# Patient Record
Sex: Male | Born: 1950 | Race: White | Hispanic: No | Marital: Single | Smoking: Never smoker
Health system: Southern US, Community
[De-identification: ages and names within clinical notes are randomized; demographics above are authoritative.]

## PROBLEM LIST (undated history)

## (undated) DIAGNOSIS — Z87442 Personal history of urinary calculi: Secondary | ICD-10-CM

## (undated) DIAGNOSIS — I639 Cerebral infarction, unspecified: Secondary | ICD-10-CM

## (undated) DIAGNOSIS — M1990 Unspecified osteoarthritis, unspecified site: Secondary | ICD-10-CM

## (undated) DIAGNOSIS — R270 Ataxia, unspecified: Secondary | ICD-10-CM

## (undated) DIAGNOSIS — R279 Unspecified lack of coordination: Secondary | ICD-10-CM

## (undated) HISTORY — PX: TONSILLECTOMY: SUR1361

---

## 2008-10-05 DIAGNOSIS — I639 Cerebral infarction, unspecified: Secondary | ICD-10-CM

## 2008-10-05 HISTORY — PX: OTHER SURGICAL HISTORY: SHX169

## 2008-10-05 HISTORY — DX: Cerebral infarction, unspecified: I63.9

## 2009-03-19 ENCOUNTER — Inpatient Hospital Stay (HOSPITAL_COMMUNITY): Admission: EM | Admit: 2009-03-19 | Discharge: 2009-04-03 | Payer: Self-pay | Admitting: Emergency Medicine

## 2009-03-19 ENCOUNTER — Ambulatory Visit: Payer: Self-pay | Admitting: Pulmonary Disease

## 2009-04-02 ENCOUNTER — Ambulatory Visit: Payer: Self-pay | Admitting: Physical Medicine & Rehabilitation

## 2009-04-03 ENCOUNTER — Inpatient Hospital Stay (HOSPITAL_COMMUNITY)
Admission: RE | Admit: 2009-04-03 | Discharge: 2009-04-18 | Payer: Self-pay | Admitting: Physical Medicine & Rehabilitation

## 2009-04-05 ENCOUNTER — Ambulatory Visit: Payer: Self-pay | Admitting: Physical Medicine & Rehabilitation

## 2009-05-07 ENCOUNTER — Encounter
Admission: RE | Admit: 2009-05-07 | Discharge: 2009-06-05 | Payer: Self-pay | Admitting: Physical Medicine & Rehabilitation

## 2009-05-07 ENCOUNTER — Emergency Department (HOSPITAL_COMMUNITY): Admission: EM | Admit: 2009-05-07 | Discharge: 2009-05-07 | Payer: Self-pay | Admitting: Emergency Medicine

## 2009-05-15 ENCOUNTER — Ambulatory Visit: Payer: Self-pay | Admitting: Family Medicine

## 2009-05-20 ENCOUNTER — Encounter
Admission: RE | Admit: 2009-05-20 | Discharge: 2009-05-24 | Payer: Self-pay | Admitting: Physical Medicine & Rehabilitation

## 2009-05-24 ENCOUNTER — Ambulatory Visit: Payer: Self-pay | Admitting: Physical Medicine & Rehabilitation

## 2010-02-27 ENCOUNTER — Encounter
Admission: RE | Admit: 2010-02-27 | Discharge: 2010-03-04 | Payer: Self-pay | Admitting: Physical Medicine & Rehabilitation

## 2010-03-04 ENCOUNTER — Ambulatory Visit: Payer: Self-pay | Admitting: Physical Medicine & Rehabilitation

## 2011-01-10 LAB — POCT I-STAT, CHEM 8
BUN: 15 mg/dL (ref 6–23)
Calcium, Ion: 1.08 mmol/L — ABNORMAL LOW (ref 1.12–1.32)
Chloride: 103 mEq/L (ref 96–112)
Creatinine, Ser: 0.9 mg/dL (ref 0.4–1.5)
Glucose, Bld: 89 mg/dL (ref 70–99)
HCT: 36 % — ABNORMAL LOW (ref 39.0–52.0)
Hemoglobin: 12.2 g/dL — ABNORMAL LOW (ref 13.0–17.0)
Potassium: 4.2 meq/L (ref 3.5–5.1)
Sodium: 136 mEq/L (ref 135–145)
TCO2: 22 mmol/L (ref 0–100)

## 2011-01-10 LAB — CBC
MCHC: 35.4 g/dL (ref 30.0–36.0)
MCV: 90.8 fL (ref 78.0–100.0)
RBC: 4.13 MIL/uL — ABNORMAL LOW (ref 4.22–5.81)
RDW: 13.3 % (ref 11.5–15.5)

## 2011-01-10 LAB — COMPREHENSIVE METABOLIC PANEL
AST: 16 U/L (ref 0–37)
CO2: 28 mEq/L (ref 19–32)
Calcium: 9.6 mg/dL (ref 8.4–10.5)
Creatinine, Ser: 0.73 mg/dL (ref 0.4–1.5)
GFR calc Af Amer: >60 mL/min (ref >60)
GFR calc non Af Amer: >60 mL/min (ref >60)
Total Protein: 6.5 g/dL (ref 6.0–8.3)

## 2011-01-10 LAB — URINALYSIS, ROUTINE W REFLEX MICROSCOPIC
Glucose, UA: NEGATIVE mg/dL
pH: 7.5 (ref 5.0–8.0)

## 2011-01-10 LAB — DIFFERENTIAL
Eosinophils Relative: 0 % (ref 0–5)
Lymphocytes Relative: 27 % (ref 12–46)
Lymphs Abs: 0.9 10*3/uL (ref 0.7–4.0)
Monocytes Relative: 8 % (ref 3–12)
Neutrophils Relative %: 64 % (ref 43–77)

## 2011-01-10 LAB — LIPASE, BLOOD: Lipase: 22 U/L (ref 11–59)

## 2011-01-11 LAB — COMPREHENSIVE METABOLIC PANEL
Albumin: 3.6 g/dL (ref 3.5–5.2)
Alkaline Phosphatase: 50 U/L (ref 39–117)
BUN: 12 mg/dL (ref 6–23)
CO2: 31 mEq/L (ref 19–32)
Chloride: 99 mEq/L (ref 96–112)
Glucose, Bld: 83 mg/dL (ref 70–99)
Potassium: 4.5 mEq/L (ref 3.5–5.1)
Total Bilirubin: 0.5 mg/dL (ref 0.3–1.2)

## 2011-01-11 LAB — DIFFERENTIAL
Basophils Absolute: 0 10*3/uL (ref 0.0–0.1)
Basophils Relative: 1 % (ref 0–1)
Monocytes Absolute: 0.5 10*3/uL (ref 0.1–1.0)
Neutro Abs: 2.4 10*3/uL (ref 1.7–7.7)

## 2011-01-11 LAB — CBC
HCT: 39.3 % (ref 39.0–52.0)
Hemoglobin: 14.1 g/dL (ref 13.0–17.0)
Platelets: 342 10*3/uL (ref 150–400)
RBC: 4.27 MIL/uL (ref 4.22–5.81)
WBC: 4.1 10*3/uL (ref 4.0–10.5)

## 2011-01-12 LAB — GLUCOSE, CAPILLARY
Glucose-Capillary: 107 mg/dL — ABNORMAL HIGH (ref 70–99)
Glucose-Capillary: 114 mg/dL — ABNORMAL HIGH (ref 70–99)
Glucose-Capillary: 121 mg/dL — ABNORMAL HIGH (ref 70–99)

## 2011-01-12 LAB — COMPREHENSIVE METABOLIC PANEL
ALT: 10 U/L (ref 0–53)
Alkaline Phosphatase: 46 U/L (ref 39–117)
CO2: 24 mEq/L (ref 19–32)
GFR calc non Af Amer: >60 mL/min (ref >60)
Glucose, Bld: 167 mg/dL — ABNORMAL HIGH (ref 70–99)
Potassium: 2.8 mEq/L — ABNORMAL LOW (ref 3.5–5.1)
Sodium: 140 mEq/L (ref 135–145)

## 2011-01-12 LAB — BASIC METABOLIC PANEL
BUN: 12 mg/dL (ref 6–23)
Chloride: 102 mEq/L (ref 96–112)
Creatinine, Ser: 0.69 mg/dL (ref 0.4–1.5)
Creatinine, Ser: 0.73 mg/dL (ref 0.4–1.5)
GFR calc Af Amer: >60 mL/min (ref >60)
GFR calc non Af Amer: >60 mL/min (ref >60)
Potassium: 3.3 mEq/L — ABNORMAL LOW (ref 3.5–5.1)
Potassium: 3.8 mEq/L (ref 3.5–5.1)
Sodium: 132 mEq/L — ABNORMAL LOW (ref 135–145)

## 2011-01-12 LAB — URINALYSIS, ROUTINE W REFLEX MICROSCOPIC
Nitrite: NEGATIVE
Specific Gravity, Urine: 1.026 (ref 1.005–1.030)
Urobilinogen, UA: 0.2 mg/dL (ref 0.0–1.0)

## 2011-01-12 LAB — CBC
HCT: 42 % (ref 39.0–52.0)
HCT: 39.6 % (ref 39.0–52.0)
Hemoglobin: 13.5 g/dL (ref 13.0–17.0)
MCHC: 34.2 g/dL (ref 30.0–36.0)
MCV: 92.1 fL (ref 78.0–100.0)
MCV: 91.5 fL (ref 78.0–100.0)
RBC: 4.55 MIL/uL (ref 4.22–5.81)
RBC: 4.32 MIL/uL (ref 4.22–5.81)
WBC: 6 10*3/uL (ref 4.0–10.5)

## 2011-01-12 LAB — CULTURE, BLOOD (ROUTINE X 2): Culture: NO GROWTH

## 2011-01-12 LAB — URINE MICROSCOPIC-ADD ON

## 2011-01-12 LAB — URINE CULTURE

## 2011-01-12 LAB — DIFFERENTIAL
Basophils Relative: 1 % (ref 0–1)
Eosinophils Absolute: 0.1 10*3/uL (ref 0.0–0.7)
Eosinophils Relative: 2 % (ref 0–5)
Monocytes Relative: 9 % (ref 3–12)
Neutrophils Relative %: 61 % (ref 43–77)

## 2011-01-12 LAB — MAGNESIUM: Magnesium: 1.8 mg/dL (ref 1.5–2.5)

## 2011-01-12 LAB — ETHANOL: Alcohol, Ethyl (B): <5 mg/dL (ref 0–10)

## 2011-01-12 LAB — OSMOLALITY: Osmolality: 290 mOsm/kg (ref 275–300)

## 2011-01-12 LAB — PROTIME-INR
INR: 1.1 (ref 0.00–1.49)
Prothrombin Time: 14.9 seconds (ref 11.6–15.2)

## 2011-01-12 LAB — POCT CARDIAC MARKERS
Myoglobin, poc: 50.9 ng/mL (ref 12–200)
Troponin i, poc: <0.05 ng/mL (ref 0.00–0.09)

## 2011-02-17 NOTE — Discharge Summary (Signed)
NAME:  Raymond Curtis, Raymond Curtis             ACCOUNT NO.:  [id number]   MEDICAL RECORD NO.:  [id number]          PATIENT TYPE:  INP   LOCATION:  3022                         FACILITY:  [facility]   PHYSICIAN:  Pramod P. Pearlean Brownie, MD    DATE OF BIRTH:  14-Oct-1950   DATE OF ADMISSION:  03/19/2009  DATE OF DISCHARGE:  04/03/2009                               DISCHARGE SUMMARY   ADMISSION DIAGNOSIS:  Intracranial bleeding.   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   HOSPITAL COURSE:  Raymond Curtis is a 60 year old Caucasian male who was  admitted for evaluation for sudden onset of confusion, dizziness,  headache while practicing yoga on the morning of the day of admission.  He had been in a pose with hyperextension of his neck for around 5-10  minutes.  When he noticed severe headache followed by confusion,  dizziness, difficulty to focus, and some numbness on the left side of  the face and clumsiness on the left body and mild slurred speech.  When  evaluated in the emergency room by Dr. Terrace Arabia, the neurologist on-call, he  was found to have mild left-sided weakness with incoordination and  nystagmus.  His NIH stroke scale was 7.  His blood pressure was not  significantly elevated.  CT scan of the head showed hemorrhage in the  posterior fossa in the left cerebellum as well as fourth ventricle with  hydrocephalus and blood in the lateral ventricles as well.  Neurosurgery  was consulted and Dr. Lovell Sheehan saw the patient and put him on emergent  ventriculostomy to treat the hydrocephalus.  The patient was admitted to  the Neurological Intensive Care Unit where blood pressure was tightly  monitored.  The patient was awake throughout the hospital stay.  He  initially complained of some subjective visual disturbance with depth  perception problems as well as left-sided incoordination and headaches.  Gradually, his headaches settled down and incoordination improved, but  he had some mild visual subjective depth perception problems.  The  patient had an emergent catheter angiogram performed by Dr. Corliss Skains on  March 19, 2009, which showed moderate-sized fast flow left cerebellar AVM  measuring 2.5 x 1.5 cm, which was fed by the left anteroinferior  cerebellar artery and a smaller branch from the left accessory  anteroinferior cerebellar artery with a single large draining vein into  the left transverse sinus.  There were no intranidal or prenidal  aneurysms or venous varices seen.  The patient was felt to be a  candidate for elective endovascular embolization verus gamma knife  treatment for this.  He was kept in the ICU until his CSF cleared from  blood and his CT scan showed improvement in hydrocephalus with complete  clearing of the ventricle systems of all blood.  He had serial CT scans  to document this.  He had his ventriculostomy catheter clamped on the  28th and repeat CT scan 24 hours later showed no significant change in  ventricular size.  The catheter was removed, and the patient was  observed  in the ICU for more than 24 hours.  He remained neurologically  stable without any new symptoms.  He was transferred to the floor and  considered stable to go for rehab since physical therapy on evaluation  found that he had significant left-sided ataxia and incoordination, and  suggested that he have inpatient rehab stay.  The plan from Neurosurgery  was to electively refer him for gamma knife procedure to [facility].  At the time of discharge, he was in a stable condition.   DISCHARGE MEDICATIONS:  None.  Not to take aspirin or nonsteroidal  antiinflammatory drugs for a month.   He was advised to follow up with Dr. Pearlean Brownie in his office in 2 months and  with Dr. Lovell Sheehan from Neurosurgery in a few weeks.           ______________________________  Sunny Schlein. Pearlean Brownie, MD      PPS/MEDQ  D:  04/04/2009  T:  04/05/2009  Job:  [id number]
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
## 2011-02-17 NOTE — Consult Note (Signed)
NAME:  Raymond Curtis, Raymond Curtis NO.:  [id number]   MEDICAL RECORD NO.:  [id number]          PATIENT TYPE:  INP   LOCATION:  3101                         FACILITY:  [facility]   PHYSICIAN:  Cristi Loron, M.D.DATE OF BIRTH:  July 21, 1951   DATE OF CONSULTATION:  03/19/2009  DATE OF DISCHARGE:                                 CONSULTATION   CHIEF COMPLAINT:  Headache.   HISTORY OF PRESENT ILLNESS:  The patient is a 60 year old white male who  was in his usual state of health until this morning.  At that time he  was doing yoga and stretching, he had a sudden onset of headache,  nausea, vomiting, dizziness, etc.  He called his sister who called the  EMS.  The patient was transferred to [facility] for further  evaluation which included a cranial CT scan demonstrated the patient had  a posterior fossa hemorrhage and a code stroke and neurosurgical  consultation was requested.   Presently, the patient is accompanied by sister and a family friend.  He  complains of headaches, nausea, vomiting, dizziness as above.  He also  complains of some decreased/blurred vision in his left eye.  He also has  facial numbness on the left.   PAST MEDICAL HISTORY:  Negative.   PAST SURGICAL HISTORY:  None.   MEDICATIONS PRIOR TO ADMISSION:  None.   No known drug allergies.   FAMILY MEDICAL HISTORY:  The patient's mother died in her 42s secondary  to complications of her cerebrovascular accident.  The patient's father  died in his 53s secondary to lung cancer.   SOCIAL HISTORY:  The patient is single.  He has no children.  He lives  in [place].  He is a Furniture conservator/restorer.  He denies tobacco and drug  use.  He drinks alcohol occasionally.   REVIEW OF SYSTEMS:  Negative except as above.   PHYSICAL EXAMINATION:  GENERAL:  A pleasant thin 60 year old white male,  complains of headache, speech difficulties, and facial numbness.  HEENT:  Normocephalic.  His pupils are equal at 3  mm, sluggish.  He has  conjugate gaze.  He has somewhat limited extraocular muscle range of  motion.  He has left-sided facial numbness with left facial droop.  There is no Battle sign or raccoon eyes.  NECK:  Supple.  There are no mass or deformities.  Thin.  He has normal  range of motion.  THORAX:  Symmetric.  ABDOMEN:  Soft.  EXTREMITIES:  No obvious deformities.  NEUROLOGIC:  The patient is alert and oriented x2 (person and place).  He could not tell me the month or the year.  He has got a Glasgow coma  scale of 14 (E4, M6, V4).  The patient's motor strength is 5/5 in  deltoid, biceps, triceps, hand grip, quadriceps, gastrocnemius, extensor  longus.  His deep tendon reflexes are 2/4 in biceps, triceps, 2+/4 in  quadriceps, gastrocnemius.  Sensory function is intact to light touch,  sensation in all tested dermatomes except his left facial numbness.  Cerebellar function is intact to rapid alternating movements of the  upper  extremities bilaterally.  Cranial nerves II-XII examined  bilaterally and grossly normal except as above.  He does have left  facial numbness.  He has left facial droop.  His uvula deviated to the  right.   IMAGES:  As I reviewed, the patient's head CT performed without contrast  at [facility] on March 10, 2009, it demonstrates the patient has  a hemorrhage in the patient's left cerebellopontine angle extending into  the fourth ventricle.  There is quite a bit of blood in the fourth  ventricle.  He has blood which extends up to the third and lateral  ventricles.  He has moderate hydrocephalus.   ASSESSMENT AND PLAN:  Posterior fossa hemorrhage or early hydrocephalus.  I discussed the issues with the patient, his sister, and the family  friend ( at the patient's request).  I have told him he has early  hydrocephalus and we discussed the various treatment options.  I  recommended we place a ventriculostomy.  I described the procedure and  the risks  including risks of infection, bleeding, ventriculostomy  malplacement and malfunction, etc.  We have also discussed alternative  of observation. As he already has hydrocephalus, I think this will  likely worsen.   We also discussed we will need to look for a source of bleeding.  I  recommended he either get CT angiogram or arteriogram.  I will place a  ventriculostomy.  I have answered all the patient's and family's  questions.  He elected to proceed with a ventriculostomy placed in the  ICU.       Cristi Loron, M.D.  Electronically Signed     JDJ/MEDQ  D:  03/19/2009  T:  03/20/2009  Job:  [id number]

## 2011-02-17 NOTE — H&P (Signed)
NAME:  Raymond Curtis, Raymond Curtis             ACCOUNT NO.:  [id number]   MEDICAL RECORD NO.:  [id number]          PATIENT TYPE:  INP   LOCATION:  3101                         FACILITY:  [facility]   PHYSICIAN:  Levert Feinstein, MD          DATE OF BIRTH:  07-21-1951   DATE OF ADMISSION:  03/19/2009  DATE OF DISCHARGE:                              HISTORY & PHYSICAL   CHIEF COMPLAINT:  Intracranial bleeding.   HISTORY OF PRESENT ILLNESS:  The patient is a 60 year old right-handed  Caucasian male, previously healthy, presenting with acute onset of  confusion, dizziness, headache, while practicing yoga this morning at 11  o'clock.   In routine, the patient practicing yoga including hyperextend his neck,  this morning around 11 o'clock, he was holding the position of  hyperextending his neck for about 5 minutes, he suddenly experienced  acute onset of left temporal and forehead area mild pressure headache  2/10, at the same time, he felt confused, dizziness, difficult to focus,  left facial numbness involving left forehead, cheek, and mandibular  region, and also inside of his left mouth, he felt clumsiness in his  left arm and leg, mild dysarthria.  Symptoms have been progressively  worse since its onset.   CT of the brain has shown posterior fossa hemorrhage, breaking into the  fourth ventricular system also involving the aqua duct, third ventricle,  and bilateral lateral ventricle, involving the left cerebellar peduncle,  mild hemorrhagic component in the basilar cystern noticed, there was  also mild obstructive hydrocephalus.   The patient remained alert, awake.   REVIEW OF SYSTEMS:  Pertinent as above.   PAST MEDICAL HISTORY:  None.   MEDICATIONS:  None.   ALLERGIES:  No known drug allergy.   PAST SURGICAL HISTORY:  None.   FAMILY HISTORY:  Noncontributory.   SOCIAL HISTORY:  He works part-time as a Leisure centre manager, lives with his  sister, and denies smoking or drinking.   PHYSICAL  EXAMINATION:  VITAL SIGNS:  He is afebrile.  Blood pressure  130/70, heart rate of 72, respiration of 16.  CARDIAC:  Regular rate and rhythm.  PULMONARY:  Clear to auscultation bilaterally.  NECK:  Supple.  No carotid bruits.  NEUROLOGIC:  He is awake, alert, oriented x4.  Mild dysarthria, but give  detailed history.  No aphasia.  Cranial nerves II-XII.  Pupil equal,  round, reactive to light, disconjugate eye movement, right side is  hyperexophoria, left side is actual hypophoria, nystagmus on extreme  gaze to the left and right up or down direction.  He also has decreased  light touch cornea on the left side, left facial weakness involving  forehead eye closure, cheek puff, uvula and tongue midline.  Head  turning, shoulder shrugging normal and symmetric.  Tongue protrusion  into cheek strength was normal.  MOTOR:  He has mild left upper extremity weakness 5-/5, sensory mildly  decreased light touch involving the left upper and lower extremity, but  no extinction.  Deep tendon reflexes brisk and symmetric.  Plantar  responses were extensor bilaterally.  Coordination, mild-to-moderate  dysmetria, left arm  and leg more obvious than the right side.  NIH  stroke scale is 7.   LABORATORY EVALUATION:  Creatinine 0.8.  Mildly elevated glucose 164.  Normal CBC.  Troponin was negative.   ASSESSMENT AND PLAN:  A 60 year old gentleman, previously healthy,  presenting with acute onset of the intraventricular, posterior fossa  bleeding, differentiation diagnosis including left posterior  communicating artery aneurysm versus transient hypertensive bleeding,  versus vascular malformation.  1. Discussed with neurosurgeon, Dr. Tressie Stalker, who will do      intraventriculostomy.  2. Also discussed with interventional radiology, Dr. Corliss Skains, will      follow by 4-vessel angiogram.  3. Admit to ICU.  4. Keep normal blood pressure.      Levert Feinstein, MD  Electronically Signed     YY/MEDQ   D:  03/19/2009  T:  03/20/2009  Job:  [id number]

## 2011-02-17 NOTE — Assessment & Plan Note (Signed)
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  He is left with a left cerebellar syndrome  with ataxia, nausea, dizziness, and vertigo.  He is now walking with  therapy, using walkers and crutches and is able to walk on his own with  these at home.  He generally requires supervision with his gait.  He is  a bit frustrated by his progress overall.  He has had some nausea, for  which he was started on Phenergan by an ER physician and this has  helped, and he seems to be weaning down off this.  He denies pain today.  Bowel and bladder functions have been normal.  He is seeing the staff at  [facility] regarding possible treatment of his AVM with Gamma  Knife.   REVIEW OF SYSTEMS:  Notable for trouble walking, dizziness, loss of  taste, weight loss due to persistent nausea.  Other pertinent positives  are above and full review is in the written health and history section  of the chart.   SOCIAL HISTORY:  The patient is single, living with his sisters who is  with him today.   PHYSICAL EXAMINATION:  Blood pressure is 104/36, pulse 60, respiratory  rate 18.  He is sating 98% on room air.  The patient is pleasant, alert  and oriented x3.  Weight appears to be generally stable.  He is slight  of build at baseline.  The patient with ataxia of his left arm, greater  than his leg.  He does tend to wean and lurch to the left, however, when  he walks.  I walked with him using handheld assistance today and he did  fairly well in all and was much improved at my last exam.  The patient  has normal sensation and strength in all 4 limbs.  He does have 5-6  beats of nystagmus with gaze to the left and left lower quadrant areas.  Cognitively, he is intact.  Heart is regular.  Chest is clear.  Abdomen  is soft, nontender.  I examined the left ear which was full of wax in  the external canal.  I was actually able to remove some wax with the tip  of  the otoscope.  I was unable to visualize the eardrum.   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  The patient is  making some nice progress.   PLAN:  1. Refilled Phenergan for breakthrough nausea symptoms.  This seems to      be helping and he has been able to wean down off this it appears.  2. Await treatment plan per [facility].  3. Encouraged ongoing therapy work on balance, gait, coordination,      etc.  I really think he has made progress, but it may take a few      more months for him to really get to a point where he is      comfortable and near to his baseline.  4. We will send the patient to ENT for evaluation of his ear.  He had      substantial wax in the ear today, but there may be some underlying      hearing loss related to the AVM and the effects on his cochlear      nerve.  This is secondhand report from treating physicians at      [facility].  Recommended use of  his Cerumenex at home to help further      clean ear if possible as well as manual cleaning with a Q-tip.  5. I will see the patient back in 2 months' time.      Ranelle Oyster, M.D.  Electronically Signed     ZTS/MedQ  D:  05/24/2009 11:01:32  T:  05/25/2009 04:54:09  Job #:  [id number]

## 2013-09-09 ENCOUNTER — Emergency Department (HOSPITAL_COMMUNITY): Payer: Medicaid Other

## 2013-09-09 ENCOUNTER — Encounter (HOSPITAL_COMMUNITY): Payer: Self-pay | Admitting: Emergency Medicine

## 2013-09-09 ENCOUNTER — Emergency Department (HOSPITAL_COMMUNITY)
Admission: EM | Admit: 2013-09-09 | Discharge: 2013-09-09 | Disposition: A | Discharge status: Home / Self Care | Payer: Medicaid Other | Attending: Emergency Medicine | Admitting: Emergency Medicine

## 2013-09-09 DIAGNOSIS — Z8673 Personal history of transient ischemic attack (TIA), and cerebral infarction without residual deficits: Secondary | ICD-10-CM | POA: Insufficient documentation

## 2013-09-09 DIAGNOSIS — N200 Calculus of kidney: Secondary | ICD-10-CM

## 2013-09-09 HISTORY — DX: Cerebral infarction, unspecified: I63.9

## 2013-09-09 LAB — POCT I-STAT, CHEM 8
BUN: 20 mg/dL (ref 6–23)
Calcium, Ion: 1.22 mmol/L (ref 1.13–1.30)
Chloride: 103 mEq/L (ref 96–112)
HCT: 40 % (ref 39.0–52.0)
Potassium: 4 mEq/L (ref 3.5–5.1)

## 2013-09-09 LAB — URINE MICROSCOPIC-ADD ON

## 2013-09-09 LAB — URINALYSIS, ROUTINE W REFLEX MICROSCOPIC
Bilirubin Urine: NEGATIVE
Glucose, UA: NEGATIVE mg/dL
Ketones, ur: NEGATIVE mg/dL
Specific Gravity, Urine: 1.017 (ref 1.005–1.030)
pH: 5.5 (ref 5.0–8.0)

## 2013-09-09 MED ORDER — ONDANSETRON 4 MG PO TBDP: 4.0000 mg | ORAL_TABLET | Freq: Three times a day (TID) | ORAL | Status: DC | PRN

## 2013-09-09 MED ORDER — OXYCODONE-ACETAMINOPHEN 5-325 MG PO TABS: 1.0000 | ORAL_TABLET | Freq: Four times a day (QID) | ORAL | Status: DC | PRN

## 2013-09-09 MED ORDER — MORPHINE SULFATE 4 MG/ML IJ SOLN
4.0000 mg | Freq: Once | INTRAMUSCULAR | Status: AC
  Administered 2013-09-09: 4 mg via INTRAVENOUS
  Filled 2013-09-09: qty 1

## 2013-09-09 MED ORDER — TAMSULOSIN HCL 0.4 MG PO CAPS: 0.4000 mg | ORAL_CAPSULE | Freq: Every day | ORAL | Status: DC

## 2013-09-09 MED ORDER — ONDANSETRON HCL 4 MG/2ML IJ SOLN
4.0000 mg | Freq: Once | INTRAMUSCULAR | Status: AC
  Administered 2013-09-09: 4 mg via INTRAVENOUS
  Filled 2013-09-09: qty 2

## 2013-09-09 NOTE — ED Notes (Signed)
Pt reports 2 days ago he noticed his urine was tea colored, then the past 2 days he had been raking leaves. Last night he had a sudden onset of left flank pain, tender to touch. sts before he went to sleep he noticed that his urine was light red. Denies dysuria, denies urinary frequency. No other complaints. Nad, skin warm and dry, resp e/u.

## 2013-09-09 NOTE — ED Notes (Signed)
Pt attempting to provide urine sample

## 2013-09-09 NOTE — ED Provider Notes (Signed)
Medical screening examination/treatment/procedure(s) were performed by non-physician practitioner and as supervising physician I was immediately available for consultation/collaboration.  EKG Interpretation   None        Doug Sou, MD 09/09/13 1521

## 2013-09-09 NOTE — ED Provider Notes (Signed)
CSN: [id number]     Arrival date & time 09/09/13  0940 History   First MD Initiated Contact with Patient 09/09/13 [phone]     Chief Complaint  Patient presents with  . Urinary Tract Infection   (Consider location/radiation/quality/duration/timing/severity/associated sxs/prior Treatment) HPI Comments: Patient presents today with a chief complaint of left flank pain.  He reports that he began having the pain last night and that the pain has been constant since that time.  However, he reports that at times the pain becomes more severe.  Nothing in particular makes the pain worse.  Pain improved with changes in position.  He reports that the pain is beginning to radiate to the left lower abdomen.  He has taken Advil for the pain with some relief.  He reports that last evening he noticed a reddish color to his urine.  He denies any other urinary symptoms.  He is able to urinate.  He reports that he is currently feeling nauseous, but no vomiting.  He denies fever or chills.  No history of kidney stones.  The history is provided by the patient.    Past Medical History  Diagnosis Date  . Stroke 2010    bleed   Past Surgical History  Procedure Laterality Date  . Tonsillectomy     No family history on file. History  Substance Use Topics  . Smoking status: Never Smoker   . Smokeless tobacco: Not on file  . Alcohol Use: No    Review of Systems  All other systems reviewed and are negative.    Allergies  Review of patient's allergies indicates no known allergies.  Home Medications   Current Outpatient Rx  Name  Route  Sig  Dispense  Refill  . Ibuprofen 200 MG CAPS   Oral   Take 1 capsule by mouth every 4 (four) hours as needed.         . Multiple Vitamins-Minerals (MULTIVITAMIN WITH MINERALS) tablet   Oral   Take 1 tablet by mouth daily.          BP 134/92  Pulse 66  Temp(Src) 99 F (37.2 C) (Oral)  Resp 22  SpO2 100% Physical Exam  Nursing note and vitals  reviewed. Constitutional: He appears well-developed and well-nourished.  HENT:  Head: Normocephalic and atraumatic.  Mouth/Throat: Oropharynx is clear and moist.  Neck: Normal range of motion. Neck supple.  Cardiovascular: Normal rate, regular rhythm and normal heart sounds.   Pulmonary/Chest: Effort normal and breath sounds normal.  Abdominal: Soft. Bowel sounds are normal. He exhibits no distension and no mass. There is no tenderness. There is no rebound and no guarding.  Left CVA tenderness  Neurological: He is alert.  Skin: Skin is warm and dry.  Psychiatric: He has a normal mood and affect.    ED Course  Procedures (including critical care time) Labs Review Labs Reviewed  URINALYSIS, ROUTINE W REFLEX MICROSCOPIC   Imaging Review Ct Abdomen Pelvis Wo Contrast  09/09/2013   CLINICAL DATA:  Left flank pain, hematuria, dark urine.  EXAM: CT ABDOMEN AND PELVIS WITHOUT CONTRAST  TECHNIQUE: Multidetector CT imaging of the abdomen and pelvis was performed following the standard protocol without intravenous contrast.  COMPARISON:  None.  FINDINGS: There is moderate hydronephrosis on the left. This appears to be secondary to a 9 x 10 mm stone at the ureterovesical junction demonstrated on image 69 of series 2. There are other nonobstructing stones in the left kidney. On the right there is no  evidence of obstruction but multiple stones are demonstrated. The largest lies in the midpole and measures approximately 5 mm in diameter. The prostate gland appears top normal in size and contains calcifications. The seminal vesicles are grossly normal.  The liver, gallbladder, spleen, partially distended stomach, pancreas, and adrenal glands appear normal for the noncontrast study. The caliber of the abdominal aorta is not normal. The psoas musculature appears normal. There is a fairly normal appearing stool and gas pattern with no evidence of obstruction or free extraluminal gas collections.  The lumbar  vertebral bodies are preserved in height. There is grade 1 anterolisthesis of L4 with respect to L5 which is on the basis of bilateral pars defects. There is considerable degenerative disc change at this level as well. The lung bases are clear.  IMPRESSION: 1. There is moderate hydronephrosis and hydroureter on the left secondary to a 9 x 10 mm diameter stone at the ureteropelvic skull junction. It appears moist to enter the urinary bladder. 2. There are nonobstructing stones elsewhere in both kidneys. 3. There is no evidence of acute hepatobiliary abnormality nor acute bowel abnormality. The abdominal aorta exhibits no evidence of an aneurysm. 4. There is grade 1 anterolisthesis of L4 with respect L5 secondary to bilateral pars defects and degenerative disc change.   Electronically Signed   By: David  [place]   On: 09/09/2013 11:44    EKG Interpretation   None      Patient discussed with Dr. Jolyne Loa with Urology.  He reports that the patient will be called by the office of [facility] to schedule a follow up appointment. MDM  No diagnosis found. Pt has been diagnosed with a Kidney Stone via CT.  Creatine WNL.  Vitals sign stable and the pt does not have irratractable vomiting or pain. Pain controlled at time of discharge.  UA not showing any signs of infection.  Patient afebrile.  Patient discussed with Urology and arranged follow up.  Pt will be dc home with pain medications, Zofran ODT, and Flomax.  Patient stable for discharge.  Return precautions given.      Santiago Glad, PA-C 09/09/13 [phone]

## 2013-09-09 NOTE — ED Notes (Signed)
Pt returned from radiology.

## 2013-09-11 ENCOUNTER — Telehealth (HOSPITAL_BASED_OUTPATIENT_CLINIC_OR_DEPARTMENT_OTHER): Payer: Self-pay

## 2013-09-11 NOTE — Telephone Encounter (Signed)
I contacted [facility] and set up appointment for Dec 29th at [facility].  Papers faxed to MD office [phone]

## 2013-10-03 ENCOUNTER — Other Ambulatory Visit: Payer: Self-pay | Admitting: Urology

## 2013-10-06 ENCOUNTER — Encounter (HOSPITAL_COMMUNITY)
Admission: RE | Admit: 2013-10-06 | Discharge: 2013-10-06 | Disposition: A | Discharge status: Home / Self Care | Payer: Medicaid Other | Source: Ambulatory Visit | Attending: Urology | Admitting: Urology

## 2013-10-06 ENCOUNTER — Encounter (HOSPITAL_COMMUNITY): Payer: Self-pay | Admitting: Pharmacy Technician

## 2013-10-06 ENCOUNTER — Encounter (HOSPITAL_COMMUNITY): Payer: Self-pay

## 2013-10-06 DIAGNOSIS — Z01810 Encounter for preprocedural cardiovascular examination: Secondary | ICD-10-CM | POA: Insufficient documentation

## 2013-10-06 DIAGNOSIS — Z01818 Encounter for other preprocedural examination: Secondary | ICD-10-CM | POA: Insufficient documentation

## 2013-10-06 DIAGNOSIS — Z01812 Encounter for preprocedural laboratory examination: Secondary | ICD-10-CM | POA: Insufficient documentation

## 2013-10-06 HISTORY — DX: Personal history of urinary calculi: Z87.442

## 2013-10-06 HISTORY — DX: Unspecified osteoarthritis, unspecified site: M19.90

## 2013-10-06 HISTORY — DX: Unspecified lack of coordination: R27.9

## 2013-10-06 HISTORY — DX: Ataxia, unspecified: R27.0

## 2013-10-06 LAB — CBC
HEMATOCRIT: 41.6 % (ref 39.0–52.0)
Hemoglobin: 14.7 g/dL (ref 13.0–17.0)
MCH: 30.8 pg (ref 26.0–34.0)
MCHC: 35.3 g/dL (ref 30.0–36.0)
MCV: 87.2 fL (ref 78.0–100.0)
Platelets: 196 10*3/uL (ref 150–400)
RBC: 4.77 MIL/uL (ref 4.22–5.81)
RDW: 12.6 % (ref 11.5–15.5)
WBC: 5.5 10*3/uL (ref 4.0–10.5)

## 2013-10-06 LAB — BASIC METABOLIC PANEL
BUN: 15 mg/dL (ref 6–23)
CHLORIDE: 100 meq/L (ref 96–112)
CO2: 29 meq/L (ref 19–32)
CREATININE: 0.76 mg/dL (ref 0.50–1.35)
Calcium: 9.7 mg/dL (ref 8.4–10.5)
GFR calc Af Amer: >90 mL/min (ref >90)
GFR calc non Af Amer: >90 mL/min (ref >90)
Glucose, Bld: 91 mg/dL (ref 70–99)
Potassium: 4.5 mEq/L (ref 3.7–5.3)
Sodium: 138 mEq/L (ref 137–147)

## 2013-10-06 NOTE — Patient Instructions (Signed)
Raymond BosRobert G Hilbert  10/06/2013                           YOUR PROCEDURE IS SCHEDULED ON: 10/10/13               PLEASE REPORT TO SHORT STAY CENTER AT : 5:30 AM               CALL THIS NUMBER IF ANY PROBLEMS THE DAY OF SURGERY :               [phone]                      REMEMBER:   Do not eat food or drink liquids AFTER MIDNIGHT   Take these medicines the morning of surgery with A SIP OF WATER: none   Do not wear jewelry, make-up   Do not wear lotions, powders, or perfumes.   Do not shave legs or underarms 12 hrs. before surgery (men may shave face)  Do not bring valuables to the hospital.  Contacts, dentures or bridgework may not be worn into surgery.  Leave suitcase in the car. After surgery it may be brought to your room.  For patients admitted to the hospital more than one night, checkout time is 11:00                          The day of discharge.   Patients discharged the day of surgery will not be allowed to drive home                             If going home same day of surgery, must have someone stay with you first                           24 hrs at home and arrange for some one to drive you home from hospital.    Special Instructions:   Please read over the following fact sheets that you were given:                                    1. [place] PREPARING FOR SURGERY SHEET                                                X_____________________________________________________________________        Failure to follow these instructions may result in cancellation of your surgery

## 2013-10-09 NOTE — H&P (Signed)
History of Present Illness Consult for left ureteral stone and hydronephrosis referred by Dr. Ethelda ChickJacubowitz. Pt developed left flank pain and red colored urine about 3 weeks ago. CT A/P revealed a 9 x 10 mm left UVJ stone (HU 930, SSD 9.5 cm, visible scout ) with proximal HUN. There were multiple renal stones - largest in right kidney 5 mm. I reviewed all the images.     Today, he is well. No pain. No hematuria. No fever. He hasn't seen a stone pass. He was given three meds and hasn't needed any pain meds. He didnt start tamsulosin. He doesn't like to take meds.     He had a ruptued AVM in 2010 and a CVA.     No prior stones.   Past Medical History Problems  1. History of stroke (V12.54) 2. History of Intracranial shunt (V45.2)  Surgical History Problems  1. History of Cranial Tap Of Shunt Tubing 2. History of Tonsillectomy Over Age 63  Current Meds 1. Ibuprofen TABS;  Therapy: (Recorded:29Dec2014) to Recorded 2. Multi-Day Vitamins TABS;  Therapy: (Recorded:29Dec2014) to Recorded  Allergies Medication  1. No Known Drug Allergies  Family History Problems  1. Family history of Death of family member : Mother, Father 2. Family history of lung cancer (V16.1) : Father  Social History Problems    Denied: History of Alcohol use   Denied: History of Caffeine use   Disabled   Never smoked tobacco (V49.89)   Single  Review of Systems Genitourinary, constitutional, skin, eye, otolaryngeal, hematologic/lymphatic, cardiovascular, pulmonary, endocrine, musculoskeletal, gastrointestinal, neurological and psychiatric system(s) were reviewed and pertinent findings if present are noted.  Gastrointestinal: nausea and heartburn.  Musculoskeletal: joint pain.  Neurological: dizziness.    Vitals Vital Signs [Data Includes: Last 1 Day]  Recorded: 29Dec2014 04:00PM  Height: 6 ft  Weight: 150 lb  BMI Calculated: 20.34 BSA Calculated: 1.89 Blood Pressure: 130 / 76 Temperature:  97.8 F Heart Rate: 52  Physical Exam Constitutional: Well nourished and well developed . No acute distress.  ENT:. The ears and nose are normal in appearance.  Neck: The appearance of the neck is normal and no neck mass is present.  Pulmonary: No respiratory distress and normal respiratory rhythm and effort.  Cardiovascular: Heart rate and rhythm are normal . No peripheral edema.  Abdomen: The abdomen is soft and nontender. No masses are palpated. No CVA tenderness. No hernias are palpable. No hepatosplenomegaly noted.  Lymphatics: The femoral and inguinal nodes are not enlarged or tender.  Skin: Normal skin turgor, no visible rash and no visible skin lesions.  Neuro/Psych:. Mood and affect are appropriate.    Results/Data Urine [Data Includes: Last 1 Day]   29Dec2014  COLOR YELLOW   APPEARANCE CLEAR   SPECIFIC GRAVITY 1.020   pH 5.5   GLUCOSE NEG mg/dL  BILIRUBIN NEG   KETONE NEG mg/dL  BLOOD NEG   PROTEIN NEG mg/dL  UROBILINOGEN 0.2 mg/dL  NITRITE NEG   LEUKOCYTE ESTERASE NEG    Old records or history reviewed:Marland Kitchen.  The following images/tracing/specimen were independently visualized: Marland Kitchen.    Procedure KUB - comparison CT - large stone at left UVJ, no ureteral stones, left kidney not well seen, multiple stones in right kidney, normal bone and bowel gas     Assessment Assessed  1. Ureteral stone (592.1) 2. Hydronephrosis (591) 3. Nephrolithiasis (592.0)  Plan Health Maintenance  1. UA With REFLEX; [Do Not Release]; Status:Complete;   Done: 29Dec2014 03:41PM Hydronephrosis, Nephrolithiasis, Ureteral stone  2.  Follow-up Schedule Surgery Office  Follow-up  Status: Hold For - Appointment   Requested for: 29Dec2014 Ureteral stone  3. KUB; Status:Resulted - Requires Verification;   Done: 29Dec2014 12:00AM  Discussion/Summary Discussed with patient nature, risks and benefits of off-label alpha blocker use, URS/stent (ureteral injury, failure to gain access, need for  stent/staged), or ESWL. He wants the "more precise" method and elects to proceed with URS. I recommended we start tamsulosin as it may pass, but not likely. Again he doesn't want to start meds. We discussed going after stone in the right, but we will hold off.     Signatures Electronically signed by : Jerilee Field, M.D.; Oct 02 2013  5:04PM EST

## 2013-10-10 ENCOUNTER — Ambulatory Visit (HOSPITAL_COMMUNITY)
Admission: RE | Admit: 2013-10-10 | Discharge: 2013-10-10 | Disposition: A | Discharge status: Home / Self Care | Payer: Medicaid Other | Source: Ambulatory Visit | Attending: Urology | Admitting: Urology

## 2013-10-10 ENCOUNTER — Encounter (HOSPITAL_COMMUNITY): Payer: Medicaid Other | Admitting: Anesthesiology

## 2013-10-10 ENCOUNTER — Encounter (HOSPITAL_COMMUNITY): Payer: Self-pay | Admitting: *Deleted

## 2013-10-10 ENCOUNTER — Encounter (HOSPITAL_COMMUNITY)
Admission: RE | Disposition: A | Discharge status: Home / Self Care | Payer: Self-pay | Source: Ambulatory Visit | Attending: Urology

## 2013-10-10 ENCOUNTER — Ambulatory Visit (HOSPITAL_COMMUNITY): Payer: Medicaid Other | Admitting: Anesthesiology

## 2013-10-10 DIAGNOSIS — M2550 Pain in unspecified joint: Secondary | ICD-10-CM | POA: Insufficient documentation

## 2013-10-10 DIAGNOSIS — R42 Dizziness and giddiness: Secondary | ICD-10-CM | POA: Insufficient documentation

## 2013-10-10 DIAGNOSIS — Z8673 Personal history of transient ischemic attack (TIA), and cerebral infarction without residual deficits: Secondary | ICD-10-CM | POA: Insufficient documentation

## 2013-10-10 DIAGNOSIS — N201 Calculus of ureter: Secondary | ICD-10-CM | POA: Insufficient documentation

## 2013-10-10 DIAGNOSIS — R12 Heartburn: Secondary | ICD-10-CM | POA: Insufficient documentation

## 2013-10-10 DIAGNOSIS — N1330 Unspecified hydronephrosis: Secondary | ICD-10-CM | POA: Insufficient documentation

## 2013-10-10 DIAGNOSIS — N200 Calculus of kidney: Secondary | ICD-10-CM | POA: Insufficient documentation

## 2013-10-10 DIAGNOSIS — R110 Nausea: Secondary | ICD-10-CM | POA: Insufficient documentation

## 2013-10-10 HISTORY — PX: HOLMIUM LASER APPLICATION: SHX5852

## 2013-10-10 HISTORY — PX: CYSTOSCOPY WITH URETEROSCOPY AND STENT PLACEMENT: SHX6377

## 2013-10-10 SURGERY — CYSTOURETEROSCOPY, WITH STENT INSERTION
Anesthesia: General | Site: Ureter | Laterality: Left

## 2013-10-10 MED ORDER — DEXAMETHASONE SODIUM PHOSPHATE 10 MG/ML IJ SOLN
INTRAMUSCULAR | Status: DC | PRN
  Administered 2013-10-10: 10 mg via INTRAVENOUS

## 2013-10-10 MED ORDER — MIDAZOLAM HCL 5 MG/5ML IJ SOLN
INTRAMUSCULAR | Status: DC | PRN
  Administered 2013-10-10: 2 mg via INTRAVENOUS

## 2013-10-10 MED ORDER — PHENYLEPHRINE HCL 10 MG/ML IJ SOLN
INTRAMUSCULAR | Status: DC | PRN
  Administered 2013-10-10 (×2): 40 ug via INTRAVENOUS

## 2013-10-10 MED ORDER — IOHEXOL 300 MG/ML  SOLN
INTRAMUSCULAR | Status: DC | PRN
  Administered 2013-10-10: 13 mL via URETHRAL

## 2013-10-10 MED ORDER — FENTANYL CITRATE 0.05 MG/ML IJ SOLN: 25.0000 ug | INTRAMUSCULAR | Status: DC | PRN

## 2013-10-10 MED ORDER — PROMETHAZINE HCL 25 MG/ML IJ SOLN: 6.2500 mg | INTRAMUSCULAR | Status: DC | PRN

## 2013-10-10 MED ORDER — PROPOFOL 10 MG/ML IV BOLUS
INTRAVENOUS | Status: AC
  Filled 2013-10-10: qty 20

## 2013-10-10 MED ORDER — LACTATED RINGERS IV SOLN
INTRAVENOUS | Status: DC | PRN
  Administered 2013-10-10 (×2): via INTRAVENOUS

## 2013-10-10 MED ORDER — MEPERIDINE HCL 50 MG/ML IJ SOLN: 6.2500 mg | INTRAMUSCULAR | Status: DC | PRN

## 2013-10-10 MED ORDER — BELLADONNA ALKALOIDS-OPIUM 16.2-60 MG RE SUPP
RECTAL | Status: DC | PRN
  Administered 2013-10-10: 1 via RECTAL

## 2013-10-10 MED ORDER — ONDANSETRON HCL 4 MG/2ML IJ SOLN
INTRAMUSCULAR | Status: AC
  Filled 2013-10-10: qty 4

## 2013-10-10 MED ORDER — FENTANYL CITRATE 0.05 MG/ML IJ SOLN
INTRAMUSCULAR | Status: AC
  Filled 2013-10-10: qty 2

## 2013-10-10 MED ORDER — PROPOFOL 10 MG/ML IV BOLUS
INTRAVENOUS | Status: DC | PRN
  Administered 2013-10-10: 180 mg via INTRAVENOUS

## 2013-10-10 MED ORDER — CIPROFLOXACIN HCL 500 MG PO TABS: 500.0000 mg | ORAL_TABLET | Freq: Two times a day (BID) | ORAL | Status: AC

## 2013-10-10 MED ORDER — SODIUM CHLORIDE 0.9 % IR SOLN
Status: DC | PRN
  Administered 2013-10-10: 1000 mL via INTRAVESICAL

## 2013-10-10 MED ORDER — DEXAMETHASONE SODIUM PHOSPHATE 10 MG/ML IJ SOLN
INTRAMUSCULAR | Status: AC
  Filled 2013-10-10: qty 1

## 2013-10-10 MED ORDER — LIDOCAINE HCL (CARDIAC) 20 MG/ML IV SOLN
INTRAVENOUS | Status: AC
  Filled 2013-10-10: qty 5

## 2013-10-10 MED ORDER — LIDOCAINE HCL 2 % EX GEL
CUTANEOUS | Status: AC
  Filled 2013-10-10: qty 10

## 2013-10-10 MED ORDER — MIDAZOLAM HCL 2 MG/2ML IJ SOLN
INTRAMUSCULAR | Status: AC
  Filled 2013-10-10: qty 2

## 2013-10-10 MED ORDER — LIDOCAINE HCL (CARDIAC) 20 MG/ML IV SOLN
INTRAVENOUS | Status: DC | PRN
  Administered 2013-10-10: 50 mg via INTRAVENOUS

## 2013-10-10 MED ORDER — CEFAZOLIN SODIUM-DEXTROSE 2-3 GM-% IV SOLR
INTRAVENOUS | Status: AC
  Filled 2013-10-10: qty 50

## 2013-10-10 MED ORDER — LACTATED RINGERS IV SOLN: INTRAVENOUS | Status: DC

## 2013-10-10 MED ORDER — FENTANYL CITRATE 0.05 MG/ML IJ SOLN
INTRAMUSCULAR | Status: DC | PRN
  Administered 2013-10-10 (×2): 50 ug via INTRAVENOUS

## 2013-10-10 MED ORDER — PHENYLEPHRINE 40 MCG/ML (10ML) SYRINGE FOR IV PUSH (FOR BLOOD PRESSURE SUPPORT)
PREFILLED_SYRINGE | INTRAVENOUS | Status: AC
  Filled 2013-10-10: qty 10

## 2013-10-10 MED ORDER — SODIUM CHLORIDE 0.9 % IR SOLN
Status: DC | PRN
  Administered 2013-10-10: 3000 mL via INTRAVESICAL

## 2013-10-10 MED ORDER — CEFAZOLIN SODIUM-DEXTROSE 2-3 GM-% IV SOLR
2.0000 g | INTRAVENOUS | Status: AC
  Administered 2013-10-10: 2 g via INTRAVENOUS

## 2013-10-10 MED ORDER — BELLADONNA ALKALOIDS-OPIUM 16.2-60 MG RE SUPP
RECTAL | Status: AC
  Filled 2013-10-10: qty 1

## 2013-10-10 MED ORDER — ONDANSETRON HCL 4 MG/2ML IJ SOLN
INTRAMUSCULAR | Status: DC | PRN
  Administered 2013-10-10: 4 mg via INTRAVENOUS

## 2013-10-10 SURGICAL SUPPLY — 19 items
BAG URO CATCHER STRL LF (DRAPE) ×4 IMPLANT
BASKET STNLS GEMINI 4WIRE 3FR (BASKET) ×4 IMPLANT
CATH INTERMIT  6FR 70CM (CATHETERS) ×4 IMPLANT
CATH URET 5FR 28IN CONE TIP (BALLOONS)
CATH URET 5FR 70CM CONE TIP (BALLOONS) IMPLANT
CONT SPEC 4OZ CLIKSEAL STRL BL (MISCELLANEOUS) ×4 IMPLANT
DRAPE CAMERA CLOSED 9X96 (DRAPES) ×4 IMPLANT
FIBER LASER FLEXIVA 365 (UROLOGICAL SUPPLIES) ×4 IMPLANT
GLOVE BIO SURGEON STRL SZ7.5 (GLOVE) ×4 IMPLANT
GLOVE BIOGEL M STRL SZ7.5 (GLOVE) ×4 IMPLANT
GOWN STRL REUS W/TWL XL LVL3 (GOWN DISPOSABLE) ×4 IMPLANT
GUIDEWIRE STR DUAL SENSOR (WIRE) ×4 IMPLANT
MANIFOLD NEPTUNE II (INSTRUMENTS) ×4 IMPLANT
PACK CYSTO (CUSTOM PROCEDURE TRAY) ×4 IMPLANT
STENT URO INLAY 6FRX26CM (STENTS) ×4 IMPLANT
SYRINGE IRR TOOMEY STRL 70CC (SYRINGE) ×4 IMPLANT
TUBING CONNECTING 10 (TUBING) ×3 IMPLANT
TUBING CONNECTING 10' (TUBING) ×1
WIRE COONS/BENSON .038X145CM (WIRE) ×4 IMPLANT

## 2013-10-10 NOTE — Anesthesia Preprocedure Evaluation (Addendum)
Anesthesia Evaluation  Patient identified by MRN, date of birth, ID band Patient awake    Reviewed: Allergy & Precautions, H&P , NPO status , Patient's Chart, lab work & pertinent test results  Airway Mallampati: I TM Distance: >3 FB Neck ROM: Full    Dental no notable dental hx.    Pulmonary neg pulmonary ROS,  breath sounds clear to auscultation  Pulmonary exam normal       Cardiovascular negative cardio ROS  Rhythm:Regular Rate:Normal     Neuro/Psych CVA, Residual Symptoms negative psych ROS   GI/Hepatic negative GI ROS, Neg liver ROS,   Endo/Other  negative endocrine ROS  Renal/GU negative Renal ROS  negative genitourinary   Musculoskeletal negative musculoskeletal ROS (+)   Abdominal   Peds negative pediatric ROS (+)  Hematology negative hematology ROS (+)   Anesthesia Other Findings   Reproductive/Obstetrics negative OB ROS                          Anesthesia Physical Anesthesia Plan  ASA: II  Anesthesia Plan: General   Post-op Pain Management:    Induction: Intravenous  Airway Management Planned: LMA  Additional Equipment:   Intra-op Plan:   Post-operative Plan: Extubation in OR  Informed Consent: I have reviewed the patients History and Physical, chart, labs and discussed the procedure including the risks, benefits and alternatives for the proposed anesthesia with the patient or authorized representative who has indicated his/her understanding and acceptance.   Dental advisory given  Plan Discussed with: CRNA  Anesthesia Plan Comments:         Anesthesia Quick Evaluation

## 2013-10-10 NOTE — Interval H&P Note (Signed)
History and Physical Interval Note:  10/10/2013 7:28 AM  Raymond Curtis  has presented today for surgery, with the diagnosis of left ureteral stone  The various methods of treatment have been discussed with the patient and family. After consideration of risks, benefits and other options for treatment, the patient has consented to  Procedure(s): LEFT URETEROSCOPY, LASER LITHOTRIPSY AND STENT PLACEMENT (Left) HOLMIUM LASER APPLICATION (Left) as a surgical intervention .  The patient's history has been reviewed, patient examined, no change in status, stable for surgery.  I have reviewed the patient's chart and labs.  Questions were answered to the patient's satisfaction.  He hasnt passed a stone. He feels well. Discussed failure to gain RG access, need for staged procedure, incision of bladder / UO to release stone.    Antony HasteEskridge, Leida Luton Ramsey

## 2013-10-10 NOTE — Discharge Instructions (Signed)
Ureteral Stent Implantation, Care After Refer to this sheet in the next few weeks. These instructions provide you with information on caring for yourself after your procedure. Your health care provider may also give you more specific instructions. Your treatment has been planned according to current medical practices, but problems sometimes occur. Call your health care provider if you have any problems or questions after your procedure. WHAT TO EXPECT AFTER THE PROCEDURE You should be back to normal activity within 48 hours after the procedure. Nausea and vomiting may occur and are commonly the result of anesthesia. It is common to experience sharp pain in the back or lower abdomen and penis with voiding. This is caused by movement of the ends of the stent with the act of urinating.It usually goes away within minutes after you have stopped urinating. HOME CARE INSTRUCTIONS Make sure to drink plenty of fluids. You may have small amounts of bleeding, causing your urine to be red. This is normal. Certain movements may trigger pain or a feeling that you need to urinate. You may be given medications to prevent infection or bladder spasms. Be sure to take all medications as directed. Only take over-the-counter or prescription medicines for pain, discomfort, or fever as directed by your health care provider. Do not take aspirin, as this can make bleeding worse.  ++++++REMOVAL OF THE STENT++++++++ Remove the stent on Thursday morning, Oct 12, 2013, by slowly pulling the string until the stent is removed. Call the office if you have any difficulty.   SEEK MEDICAL CARE IF:  You experience increasing pain.  Your pain medication is not working. SEEK IMMEDIATE MEDICAL CARE IF:  Your urine is dark red or has blood clots.  You are leaking urine (incontinent).  You have a fever, chills, feeling sick to your stomach (nausea), or vomiting.  Your pain is not relieved by pain medication.  The end of the stent  comes out of the urethra.  You are unable to urinate.  Cystoscopy Cystoscopy is a procedure that is used to help your caregiver diagnose and sometimes treat conditions that affect your lower urinary tract. Your lower urinary tract includes your bladder and the tube through which urine passes from your bladder out of your body (urethra). Cystoscopy is performed with a thin, tube-shaped instrument (cystoscope). The cystoscope has lenses and a light at the end so that your caregiver can see inside your bladder. The cystoscope is inserted at the entrance of your urethra. Your caregiver guides it through your urethra and into your bladder. There are two main types of cystoscopy:  Flexible cystoscopy (with a flexible cystoscope).  Rigid cystoscopy (with a rigid cystoscope). Cystoscopy may be recommended for many conditions, including:  Urinary tract infections.  Blood in your urine (hematuria).  Loss of bladder control (urinary incontinence) or overactive bladder.  Unusual cells found in a urine sample.  Urinary blockage.  Painful urination. Cystoscopy may also be done to remove a sample of your tissue to be checked under a microscope (biopsy). It may also be done to remove or destroy bladder stones. LET YOUR CAREGIVER KNOW ABOUT:  Allergies to food or medicine.  Medicines taken, including vitamins, herbs, eyedrops, over-the-counter medicines, and creams.  Use of steroids (by mouth or creams).  Previous problems with anesthetics or numbing medicines.  History of bleeding problems or blood clots.  Previous surgery.  Other health problems, including diabetes and kidney problems.  Possibility of pregnancy, if this applies. PROCEDURE The area around the opening to your  urethra will be cleaned. A medicine to numb your urethra (local anesthetic) is used. If a tissue sample or stone is removed during the procedure, you may be given a medicine to make you sleep (general anesthetic). Your  caregiver will gently insert the tip of the cystoscope into your urethra. The cystoscope will be slowly glided through your urethra and into your bladder. Sterile fluid will flow through the cystoscope and into your bladder. The fluid will expand and stretch your bladder. This gives your caregiver a better view of your bladder walls. The procedure lasts about 15 20 minutes. AFTER THE PROCEDURE If a local anesthetic is used, you will be allowed to go home as soon as you are ready. If a general anesthetic is used, you will be taken to a recovery area until you are stable. You may have temporary bleeding and burning on urination. Document Released: 09/18/2000 Document Revised: 06/15/2012 Document Reviewed: 03/14/2012 [facility] Patient Information 2014 [place], [place].

## 2013-10-10 NOTE — Anesthesia Postprocedure Evaluation (Signed)
  Anesthesia Post-op Note  Patient: Raymond BosRobert G Curtis  Procedure(s) Performed: Procedure(s) (LRB): LEFT URETEROSCOPY, LASER LITHOTRIPSY AND STENT PLACEMENT LEFT RETRGRADE (Left) HOLMIUM LASER APPLICATION (Left)  Patient Location: PACU  Anesthesia Type: General  Level of Consciousness: awake and alert   Airway and Oxygen Therapy: Patient Spontanous Breathing  Post-op Pain: mild  Post-op Assessment: Post-op Vital signs reviewed, Patient's Cardiovascular Status Stable, Respiratory Function Stable, Patent Airway and No signs of Nausea or vomiting  Last Vitals:  Filed Vitals:   10/10/13 0930  BP: 120/75  Pulse: 51  Temp: 36.3 C  Resp: 14    Post-op Vital Signs: stable   Complications: No apparent anesthesia complications

## 2013-10-10 NOTE — Op Note (Signed)
Preoperative diagnosis: Left ureteral stone, left hydronephrosis Postoperative diagnosis: Same  Procedure: Exam under anesthesia Cystoscopy Left ureteroscopy with holmium laser lithotripsy Left retrograde pyelogram Left ureteral stent placement  Surgeon: Mena GoesEskridge  Anesthesia: Gen.  Findings:  On exam under anesthesia the penis was normal without lesion. The testicles were descended with the right testicle smaller than the left showing some atrophy. The testicles were palpably normal without mass. On digital rectal exam the prostate was palpably normal and symmetric without hard area or nodule.  On cystoscopy the urethra and bladder were unremarkable other than the left intramural ureter was distended and heaped up from the known stone.  A left ureteroscopy the stone was located in the intramural ureter and the distal ureter as far as the iliac was dilated but normal without stone or stricture after removing all the fragments.  Left retrograde pyelogram - the stone was visible in the region of the left UVJ on scout imaging. Retrograde pyelogram revealed a single ureter single collecting system unit. The collecting system showed mild dilation but no filling defect. The ureter showed mild dilation but no filling defect. The system drained briskly.  Description of procedure: After consent was obtained patient brought the operating room. After adequate anesthesia he is placed in lithotomy position. An exam under anesthesia was performed. He was prepped and draped in the usual sterile fashion.  A timeout was performed to confirm the patient and procedure. A cystoscope was passed per urethra and the bladder examined. The left UO was then cannulated with a sensor wire which was angled around the stone and passed and coiled in the collecting system. The ureteroscope was then advanced just into the intramural ureter. Using a 365  laser fiber the stone was fragmented into small pieces. These were  cleared sequentially with a [employer] basket. Repeat ureteroscopy all the way up to the iliacs revealed no other fragments and a normal ureter apart from some mild dilation. The left UO had some edema and mild hematuria from the sequential scope passing. Therefore I tried to perform a retrograde the 6 [place] open-ended catheter to check the upper tract but due to the dilation the contrast simply washed into the bladder. Therefore the bladder was drained and the scope removed.  A 6 French open-ended catheter was advanced over the sensor wire up into the proximal ureter and the wire removed. Retrograde contrast was injected and the 6 French catheter removed to obtain retrograde pyelogram.  The cystoscope was passed per urethra and there was some mild edema and bleeding from the ureteral orifice therefore thought it best to leave a stent for a few days. The sensor wire was passed again and coiled in the collecting system without difficulty. A 6 x 26 cm stent was advanced and the wire removed. A good coil was seen in the collecting system and a good coil in the bladder. There is some contrast remaining in the collecting system confirming the stent placement in the collecting system.  The bladder was drained and the scope removed. The string was left on the stent.  The patient was awakened taken to recovery room in stable condition.  Complications: None Blood loss: Minimal  Specimens: Stone fragments  Drains: 6 x 26 cm left ureteral stent  Disposition: Patient stable to PACU

## 2013-10-10 NOTE — Transfer of Care (Signed)
Immediate Anesthesia Transfer of Care Note  Patient: Raymond BosRobert G Curtis  Procedure(s) Performed: Procedure(s): LEFT URETEROSCOPY, LASER LITHOTRIPSY AND STENT PLACEMENT LEFT RETRGRADE (Left) HOLMIUM LASER APPLICATION (Left)  Patient Location: PACU  Anesthesia Type:General  Level of Consciousness: awake and alert   Airway & Oxygen Therapy: Patient Spontanous Breathing and Patient connected to face mask oxygen  Post-op Assessment: Report given to PACU RN and Post -op Vital signs reviewed and stable  Post vital signs: Reviewed and stable  Complications: No apparent anesthesia complications

## 2013-10-11 ENCOUNTER — Encounter (HOSPITAL_COMMUNITY): Payer: Self-pay | Admitting: Urology

## 2013-10-11 NOTE — Anesthesia Postprocedure Evaluation (Signed)
  Anesthesia Post-op Note  Patient: Darnelle BosRobert G Rauda  Procedure(s) Performed: Procedure(s) (LRB): LEFT URETEROSCOPY, LASER LITHOTRIPSY AND STENT PLACEMENT LEFT RETRGRADE (Left) HOLMIUM LASER APPLICATION (Left)  Patient Location: PACU  Anesthesia Type: General  Level of Consciousness: awake and alert   Airway and Oxygen Therapy: Patient Spontanous Breathing  Post-op Pain: mild  Post-op Assessment: Post-op Vital signs reviewed, Patient's Cardiovascular Status Stable, Respiratory Function Stable, Patent Airway and No signs of Nausea or vomiting  Last Vitals:  Filed Vitals:   10/10/13 1053  BP:   Pulse: 51  Temp:   Resp:     Post-op Vital Signs: stable   Complications: No apparent anesthesia complications

## 2015-09-06 IMAGING — CT CT ABD-PELV W/O CM
2 of 4 series · 16 of 46 positions shown, 18 images · non-contrast
Comparison: None.

CLINICAL DATA: Left flank pain, hematuria, dark urine.

EXAM:
CT ABDOMEN AND PELVIS WITHOUT CONTRAST
TECHNIQUE: Multidetector CT imaging of the abdomen and pelvis was performed
following the standard protocol without intravenous contrast.

[Series 2: stone study · axial · 0.76mm/px · z∈[+7,+397]mm · 13 of 86 slices shown, 15 images]
[im 4/86  soft-tissue]
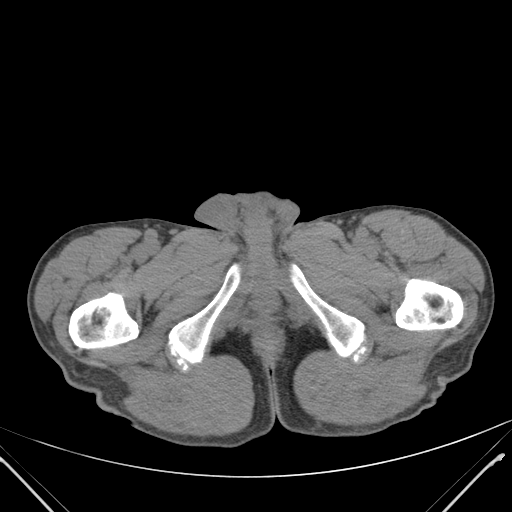
[im 4/86  bone]
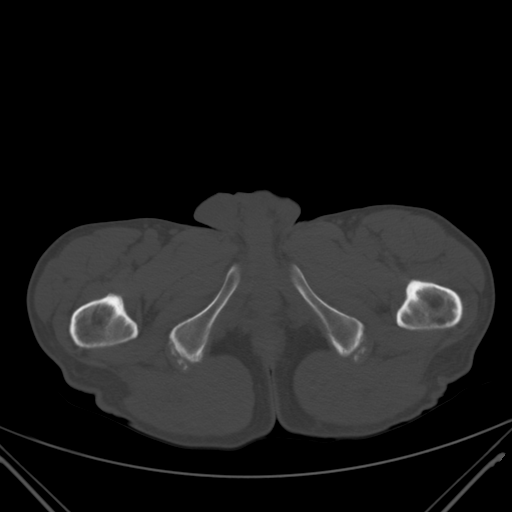
[im 12/86  soft-tissue]
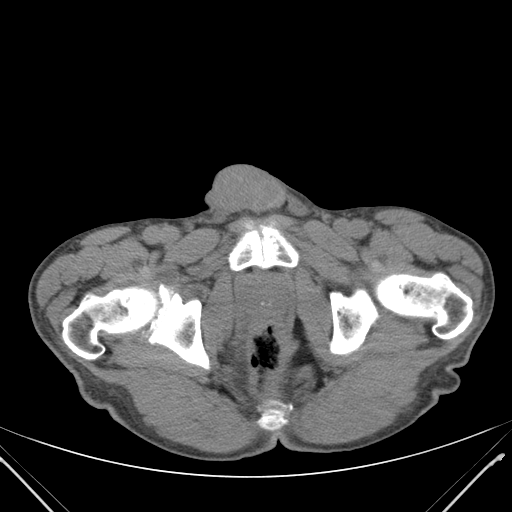
[im 19/86  soft-tissue]
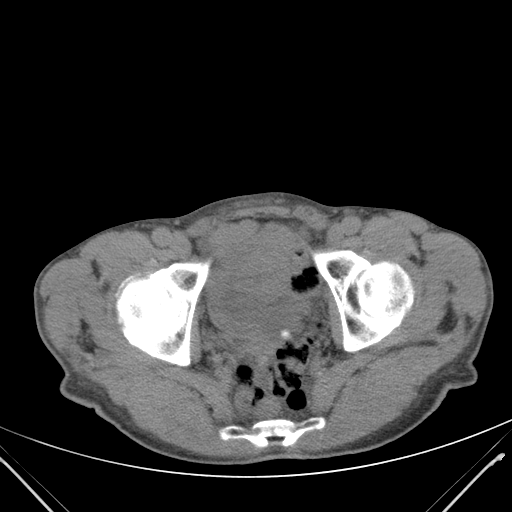
[im 23/86  soft-tissue]
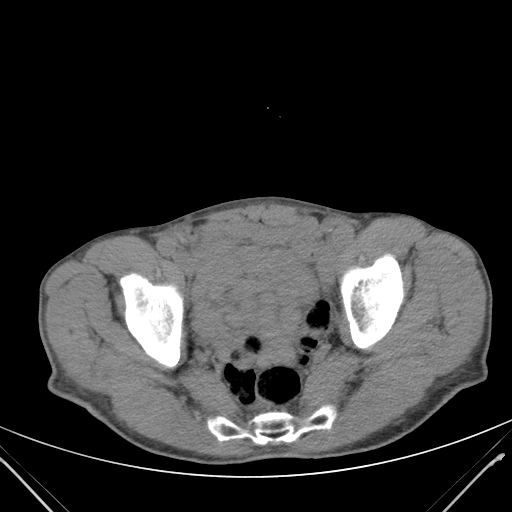
[im 30/86  soft-tissue]
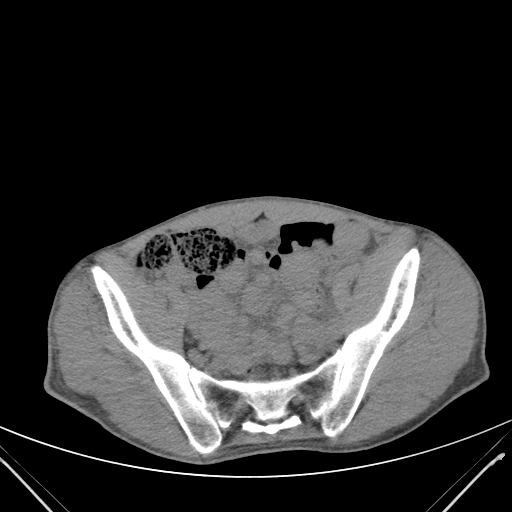
[im 37/86  soft-tissue]
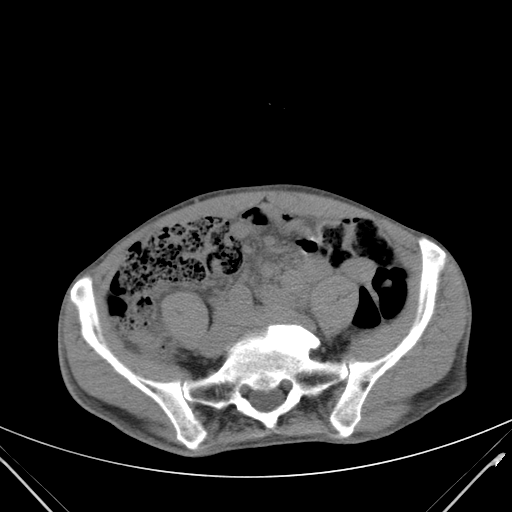
[im 45/86  soft-tissue]
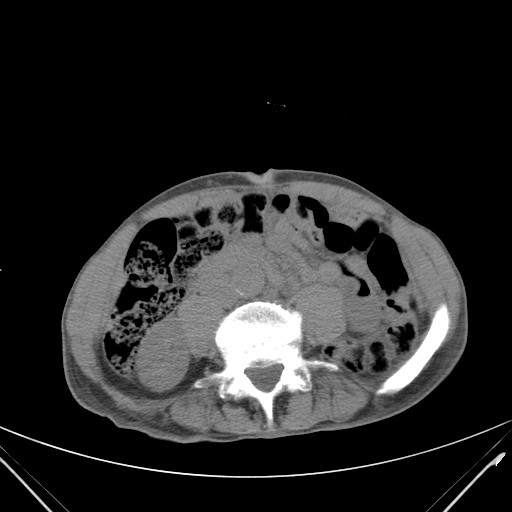
[im 49/86  soft-tissue]
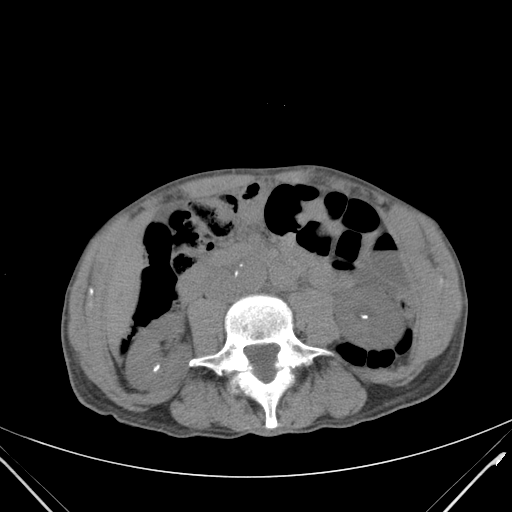
[im 56/86  soft-tissue]
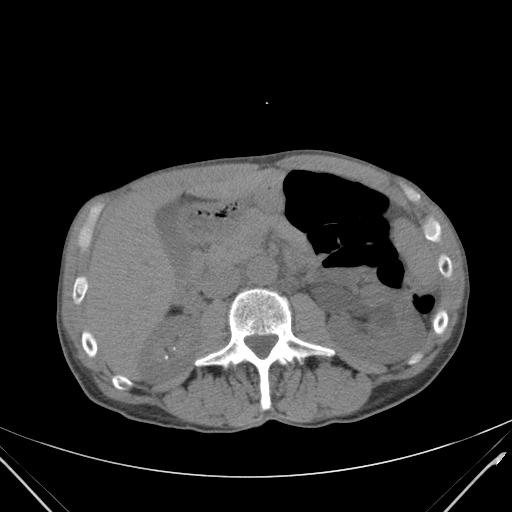
[im 56/86  bone]
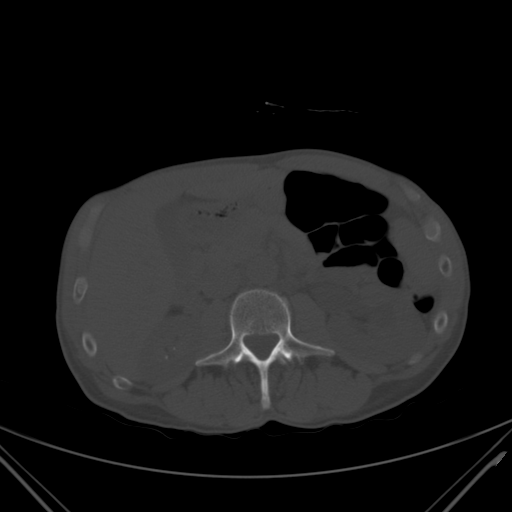
[im 63/86  soft-tissue]
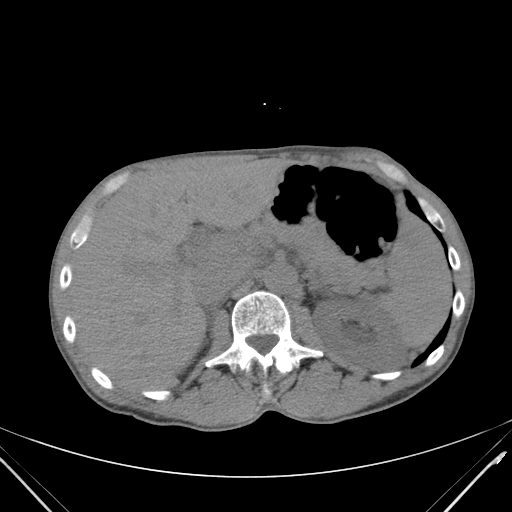
[im 67/86  soft-tissue]
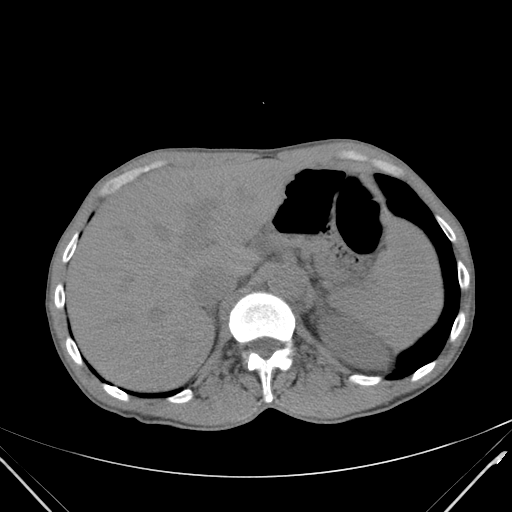
[im 74/86  soft-tissue]
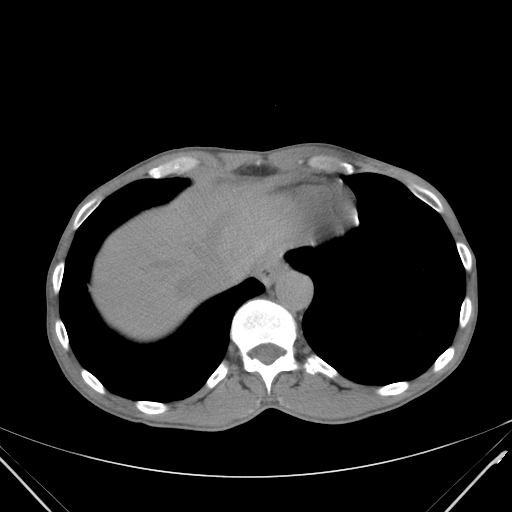
[im 82/86  soft-tissue]
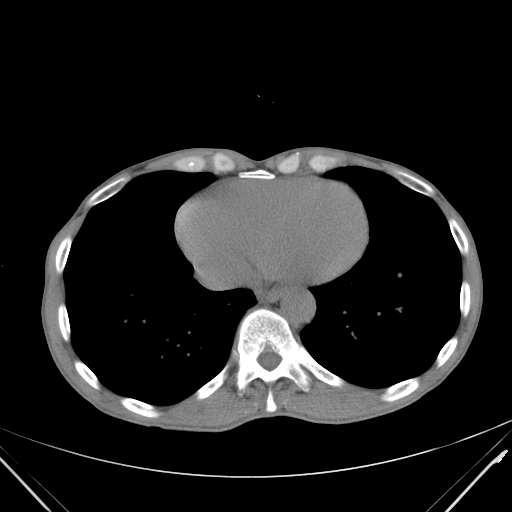

[mpr, coronals, coronal · coronal · 0.83mm/px · 3 of 89 slices shown]
[im 30/89  soft-tissue]
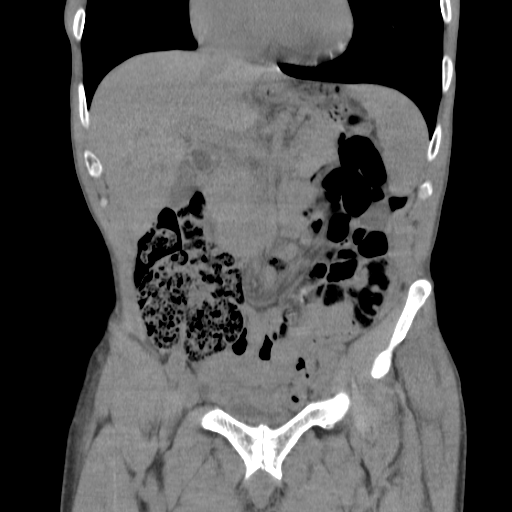
[im 40/89  soft-tissue]
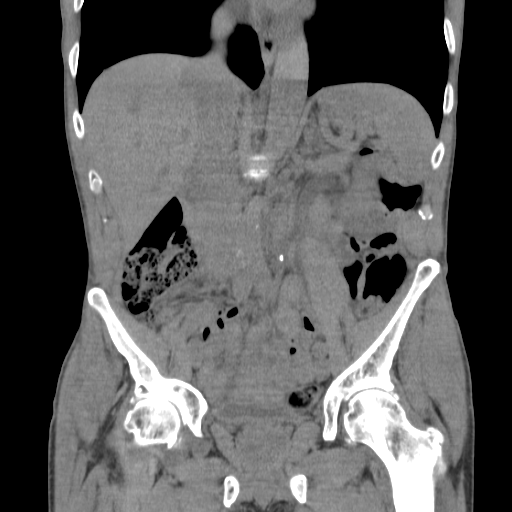
[im 49/89  soft-tissue]
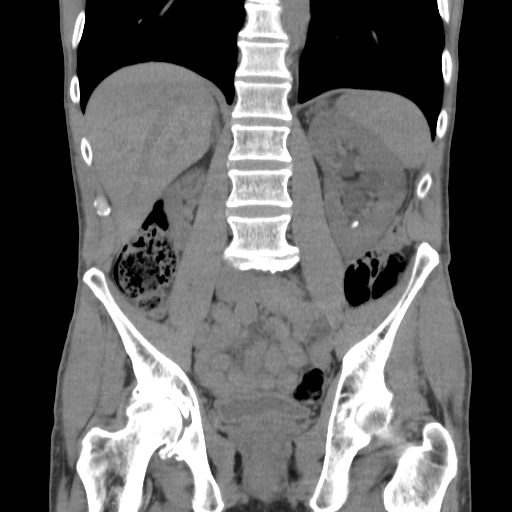

[16 of 46 positions shown; findings below may reference images not displayed]

FINDINGS: There is moderate hydronephrosis on the left. This appears to be
secondary to a 9 x 10 mm stone at the ureterovesical junction
demonstrated on image 69 of series 2. There are other nonobstructing
stones in the left kidney. On the right there is no evidence of
obstruction but multiple stones are demonstrated. The largest lies
in the midpole and measures approximately 5 mm in diameter. The
prostate gland appears top normal in size and contains
calcifications. The seminal vesicles are grossly normal.

The liver, gallbladder, spleen, partially distended stomach,
pancreas, and adrenal glands appear normal for the noncontrast
study. The caliber of the abdominal aorta is not normal. The psoas
musculature appears normal. There is a fairly normal appearing stool
and gas pattern with no evidence of obstruction or free extraluminal
gas collections.

The lumbar vertebral bodies are preserved in height. There is grade
1 anterolisthesis of L4 with respect to L5 which is on the basis of
bilateral pars defects. There is considerable degenerative disc
change at this level as well. The lung bases are clear.
IMPRESSION: 1. There is moderate hydronephrosis and hydroureter on the left
secondary to a 9 x 10 mm diameter stone at the ureteropelvic skull
junction. It appears moist to enter the urinary bladder.
2. There are nonobstructing stones elsewhere in both kidneys.
3. There is no evidence of acute hepatobiliary abnormality nor acute
bowel abnormality. The abdominal aorta exhibits no evidence of an
aneurysm.
4. There is grade 1 anterolisthesis of L4 with respect L5 secondary
to bilateral pars defects and degenerative disc change.

## 2022-03-13 ENCOUNTER — Encounter (HOSPITAL_COMMUNITY): Payer: Self-pay | Admitting: *Deleted

## 2022-03-13 ENCOUNTER — Other Ambulatory Visit: Payer: Self-pay

## 2022-03-13 ENCOUNTER — Emergency Department (HOSPITAL_COMMUNITY)
Admission: EM | Admit: 2022-03-13 | Discharge: 2022-03-14 | Disposition: A | Discharge status: Home / Self Care | Payer: Medicare Other | Attending: Emergency Medicine | Admitting: Emergency Medicine

## 2022-03-13 DIAGNOSIS — S59912A Unspecified injury of left forearm, initial encounter: Secondary | ICD-10-CM | POA: Diagnosis present

## 2022-03-13 DIAGNOSIS — W540XXA Bitten by dog, initial encounter: Secondary | ICD-10-CM | POA: Insufficient documentation

## 2022-03-13 DIAGNOSIS — S51812A Laceration without foreign body of left forearm, initial encounter: Secondary | ICD-10-CM | POA: Diagnosis not present

## 2022-03-13 DIAGNOSIS — Z23 Encounter for immunization: Secondary | ICD-10-CM | POA: Insufficient documentation

## 2022-03-13 NOTE — ED Provider Triage Note (Signed)
Emergency Medicine Provider Triage Evaluation Note  Raymond Curtis , a 71 y.o. male  was evaluated in triage.  Pt complains of dog attack.  His [place] attacked him while he was trying to administer medication.  Up-to-date on vaccines.  Last tetanus 5 years ago  Review of Systems  Positive: Pain Negative: Numbness and tingling  Physical Exam  BP (!) 156/98 (BP Location: Right Arm)   Pulse 66   Temp 98.3 F (36.8 C) (Oral)   Resp 20   SpO2 98%  Gen:   Awake, no distress   Resp:  Normal effort  MSK:   Moves extremities without difficulty  Other:  Superficial bite mark to the right forearm.  Additional hematoma to the right upper extremity.  Gaping wound in patient's left AC, bleeding controlled.  Mild scratches and puncture wounds to the left forearm.  Medical Decision Making  Medically screening exam initiated at 7:11 PM.  Appropriate orders placed.  Raymond Curtis was informed that the remainder of the evaluation will be completed by another provider, this initial triage assessment does not replace that evaluation, and the importance of remaining in the ED until their evaluation is complete.     Raymond Curtis, [place] 03/13/22 1913

## 2022-03-13 NOTE — ED Triage Notes (Signed)
Pt arrived after being attacked by his german shepard when trying to medicate the dog's paw. Dog is vaccinated. Open bite to L outer AC; hematoma to R AC, multiple over marks, bleeding controlled

## 2022-03-14 DIAGNOSIS — S51812A Laceration without foreign body of left forearm, initial encounter: Secondary | ICD-10-CM | POA: Diagnosis not present

## 2022-03-14 MED ORDER — AMOXICILLIN-POT CLAVULANATE 875-125 MG PO TABS
1.0000 | ORAL_TABLET | Freq: Once | ORAL | Status: AC
  Administered 2022-03-14: 1 via ORAL
  Filled 2022-03-14: qty 1

## 2022-03-14 MED ORDER — LIDOCAINE-EPINEPHRINE (PF) 2 %-1:200000 IJ SOLN
20.0000 mL | Freq: Once | INTRAMUSCULAR | Status: AC
  Administered 2022-03-14: 20 mL
  Filled 2022-03-14: qty 20

## 2022-03-14 MED ORDER — BACITRACIN ZINC 500 UNIT/GM EX OINT
TOPICAL_OINTMENT | Freq: Two times a day (BID) | CUTANEOUS | Status: DC
  Administered 2022-03-14: 1 via TOPICAL
  Filled 2022-03-14: qty 0.9

## 2022-03-14 MED ORDER — OXYCODONE-ACETAMINOPHEN 5-325 MG PO TABS
2.0000 | ORAL_TABLET | Freq: Once | ORAL | Status: DC
  Filled 2022-03-14: qty 2

## 2022-03-14 MED ORDER — AMOXICILLIN-POT CLAVULANATE 875-125 MG PO TABS: 1.0000 | ORAL_TABLET | Freq: Two times a day (BID) | ORAL | 0 refills | Status: AC

## 2022-03-14 MED ORDER — TETANUS-DIPHTH-ACELL PERTUSSIS 5-2.5-18.5 LF-MCG/0.5 IM SUSY
0.5000 mL | PREFILLED_SYRINGE | Freq: Once | INTRAMUSCULAR | Status: AC
  Administered 2022-03-14: 0.5 mL via INTRAMUSCULAR
  Filled 2022-03-14: qty 0.5

## 2022-03-14 NOTE — ED Notes (Signed)
RN irrigated pt's bite lacerations on bilateral forearms, covered areas with bacitracin, and then wrapped with non-adhesive pads and gauze wrap

## 2022-03-14 NOTE — ED Provider Notes (Signed)
[facility] EMERGENCY DEPARTMENT Provider Note   CSN: [id number] Arrival date & time: 03/13/22  1843     History  Chief Complaint  Patient presents with   Animal Bite    Raymond Curtis is a 71 y.o. male.  71 yo M here after being bit by a dog.  Patient was trying to do something to his dog's feet and the dog extremities and pain and got upset and bit the patient.  He suffered multiple abrasions and small puncture wounds but most concerning is a laceration to his left AC fossa area.  He has no numbness, tingling or weakness in that arm.  No significant amount of blood loss.  Unknown tetanus shot.  Animal is up-to-date on shots and been acting normally prior to this and does seem to be instigated.   Animal Bite      Home Medications Prior to Admission medications   Medication Sig Start Date End Date Taking? Authorizing Provider  amoxicillin-clavulanate (AUGMENTIN) 875-125 MG tablet Take 1 tablet by mouth every 12 (twelve) hours. 03/14/22  Yes Jaylene Schrom, Barbara Cower, MD  ciprofloxacin (CIPRO) 500 MG tablet Take 1 tablet (500 mg total) by mouth 2 (two) times daily. 10/11/13   Jerilee Field, MD  Ibuprofen 200 MG CAPS Take 1 capsule by mouth every 4 (four) hours as needed (pain).     [provider]  ondansetron (ZOFRAN-ODT) 4 MG disintegrating tablet Take 4 mg by mouth every 8 (eight) hours as needed for nausea or vomiting. 09/09/13   Santiago Glad, PA-C      Allergies    Patient has no known allergies.    Review of Systems   Review of Systems  Physical Exam Updated Vital Signs BP 105/66   Pulse 60   Temp 98.3 F (36.8 C) (Oral)   Resp 16   SpO2 98%  Physical Exam Vitals and nursing note reviewed.  Constitutional:      Appearance: He is well-developed.  HENT:     Head: Normocephalic and atraumatic.     Mouth/Throat:     Mouth: Mucous membranes are moist.     Pharynx: Oropharynx is clear.  Eyes:     Pupils: Pupils are equal, round, and  reactive to light.  Cardiovascular:     Rate and Rhythm: Normal rate.  Pulmonary:     Effort: Pulmonary effort is normal. No respiratory distress.  Abdominal:     General: There is no distension.  Musculoskeletal:        General: Normal range of motion.     Cervical back: Normal range of motion.     Comments: Patient is able to touch his thumb to all 4 of his other fingers on his left hand, make a fist, make a high-five and give a thumbs up.  He has sensation to light touch in all 5 of his fingers as well.  Skin:    General: Skin is warm and dry.     Comments: Multiple abrasions and right arm to include 1 puncture wound on right forearm that is hemostatic and approximately 0.25 cm in diameter.  Gaping wound in his left AC fossa at irregularly-shaped but overall is probably about 3 cm.  Does seem to violate the muscle belly on that side.  No obvious arterial, ligamentous or tendinous damage.  Multiple abrasions and superficial lacerations to left forearm otherwise.  Neurological:     General: No focal deficit present.     Mental Status: He is alert.  ED Results / Procedures / Treatments   Labs (all labs ordered are listed, but only abnormal results are displayed) Labs Reviewed - No data to display  EKG None  Radiology No results found.  Procedures .Marland KitchenLaceration Repair  Date/Time: 03/14/2022 2:02 AM  Performed by: Marily Memos, MD Authorized by: Marily Memos, MD   Consent:    Consent obtained:  Verbal   Consent given by:  Patient   Risks discussed:  Infection, need for additional repair, nerve damage, poor wound healing, poor cosmetic result, pain, retained foreign body, tendon damage and vascular damage   Alternatives discussed:  No treatment, delayed treatment and observation Universal protocol:    Procedure explained and questions answered to patient or proxy's satisfaction: yes     Relevant documents present and verified: yes     Site/side marked: yes      Patient identity confirmed:  Hospital-assigned identification number and arm band Anesthesia:    Anesthesia method:  Local infiltration   Local anesthetic:  Lidocaine 2% WITH epi Laceration details:    Location:  Shoulder/arm   Shoulder/arm location:  L lower arm   Length (cm):  3   Depth (mm):  5 Pre-procedure details:    Preparation:  Patient was prepped and draped in usual sterile fashion and imaging obtained to evaluate for foreign bodies Exploration:    Imaging outcome: foreign body not noted     Wound exploration: wound explored through full range of motion     Contaminated: no   Treatment:    Area cleansed with:  Saline   Amount of cleaning:  Extensive   Irrigation solution:  Sterile water   Irrigation volume:  150   Irrigation method:  Syringe Skin repair:    Repair method:  Sutures   Suture size:  3-0   Suture material:  Prolene   Suture technique:  Simple interrupted   Number of sutures:  7 Approximation:    Approximation:  Close Repair type:    Repair type:  Simple Post-procedure details:    Dressing:  Antibiotic ointment   Procedure completion:  Tolerated well, no immediate complications     Medications Ordered in ED Medications  oxyCODONE-acetaminophen (PERCOCET/ROXICET) 5-325 MG per tablet 2 tablet (2 tablets Oral Not Given 03/14/22 0049)  bacitracin ointment (1 application  Topical Given 03/14/22 0053)  lidocaine-EPINEPHrine (XYLOCAINE W/EPI) 2 %-1:200000 (PF) injection 20 mL (has no administration in time range)  amoxicillin-clavulanate (AUGMENTIN) 875-125 MG per tablet 1 tablet (has no administration in time range)  Tdap (BOOSTRIX) injection 0.5 mL (0.5 mLs Intramuscular Given 03/14/22 0050)    ED Course/ Medical Decision Making/ A&P                           Medical Decision Making Risk OTC drugs. Prescription drug management.   Wound repaired as above, loosely approximated. Patient refused xrays and pain meds. Abx started.    Final Clinical  Impression(s) / ED Diagnoses Final diagnoses:  Dog bite, initial encounter    Rx / DC Orders ED Discharge Orders          Ordered    amoxicillin-clavulanate (AUGMENTIN) 875-125 MG tablet  Every 12 hours        03/14/22 0159              Zaraya Delauder, Barbara Cower, MD 03/14/22 5003

## 2022-03-14 NOTE — ED Notes (Signed)
E-signature pad unavailable at time of pt discharge. This RN discussed discharge materials with pt and answered all pt questions. Pt stated understanding of discharge material. ? ?

## 2022-03-25 ENCOUNTER — Emergency Department (HOSPITAL_COMMUNITY): Payer: Medicare Other

## 2022-03-25 ENCOUNTER — Emergency Department (HOSPITAL_COMMUNITY)
Admission: EM | Admit: 2022-03-25 | Discharge: 2022-03-25 | Disposition: A | Discharge status: Home / Self Care | Payer: Medicare Other | Attending: Emergency Medicine | Admitting: Emergency Medicine

## 2022-03-25 ENCOUNTER — Encounter (HOSPITAL_COMMUNITY): Payer: Self-pay

## 2022-03-25 ENCOUNTER — Other Ambulatory Visit: Payer: Self-pay

## 2022-03-25 DIAGNOSIS — R2231 Localized swelling, mass and lump, right upper limb: Secondary | ICD-10-CM | POA: Insufficient documentation

## 2022-03-25 DIAGNOSIS — M7989 Other specified soft tissue disorders: Secondary | ICD-10-CM

## 2022-03-25 DIAGNOSIS — Z4802 Encounter for removal of sutures: Secondary | ICD-10-CM | POA: Diagnosis not present

## 2022-03-25 NOTE — Discharge Instructions (Addendum)
We removed 7 stiches from your left arm today.  You will need to follow-up with orthopedics in order to have the mass of your right upper extremity further evaluated.  Also provided a referral to a primary care physician, should you need to schedule any other appointment.

## 2022-03-25 NOTE — ED Notes (Signed)
Patient verbalizes understanding of discharge instructions. Opportunity for questioning and answers were provided. Armband removed by staff, pt discharged from ED.  

## 2022-03-25 NOTE — ED Provider Notes (Cosign Needed)
[facility] EMERGENCY DEPARTMENT Provider Note   CSN: [id number] Arrival date & time: 03/25/22  1310     History  Chief Complaint  Patient presents with   Suture / Staple Removal   Mass    CELESTE CANDELAS is a 71 y.o. male.  71 year old male with no past medical history presents to the ED requesting suture removal along with revision of right forearm cyst.  Patient was bit by his own dog approximately 7 days ago, had 7 stitches placed to the left forearm, is requesting these to be removed.  In addition, patient reports also being bit on the right arm, noted a mass that showed up right after he was bit.  This was able to be reduced in size by elevating along with massage.  It stopped being able to be reduced 2 days ago.  He is concerned for any acute process.  He is without any fevers, no infectious signs, no other complaints.  The history is provided by the patient and medical records.  Suture / Staple Removal Pertinent negatives include no chest pain, no abdominal pain, no headaches and no shortness of breath.       Home Medications Prior to Admission medications   Medication Sig Start Date End Date Taking? Authorizing Provider  amoxicillin-clavulanate (AUGMENTIN) 875-125 MG tablet Take 1 tablet by mouth every 12 (twelve) hours. 03/14/22   Mesner, Barbara Cower, MD  ciprofloxacin (CIPRO) 500 MG tablet Take 1 tablet (500 mg total) by mouth 2 (two) times daily. 10/11/13   Jerilee Field, MD  Ibuprofen 200 MG CAPS Take 1 capsule by mouth every 4 (four) hours as needed (pain).     [provider]  ondansetron (ZOFRAN-ODT) 4 MG disintegrating tablet Take 4 mg by mouth every 8 (eight) hours as needed for nausea or vomiting. 09/09/13   Santiago Glad, PA-C      Allergies    Patient has no known allergies.    Review of Systems   Review of Systems  Constitutional:  Negative for chills and fever.  HENT:  Negative for sore throat.   Respiratory:  Negative for  shortness of breath.   Cardiovascular:  Negative for chest pain.  Gastrointestinal:  Negative for abdominal pain, diarrhea and vomiting.  Genitourinary:  Negative for flank pain.  Musculoskeletal:  Negative for back pain and myalgias.  Skin:  Negative for color change and wound.  Neurological:  Negative for light-headedness and headaches.    Physical Exam Updated Vital Signs BP 126/75   Pulse (!) 59   Temp 98.4 F (36.9 C) (Oral)   Resp 20   Ht 6' (1.829 m)   Wt 70.3 kg   SpO2 100%   BMI 21.02 kg/m  Physical Exam Vitals and nursing note reviewed.  Constitutional:      Appearance: Normal appearance.  HENT:     Head: Normocephalic and atraumatic.     Mouth/Throat:     Mouth: Mucous membranes are moist.  Cardiovascular:     Rate and Rhythm: Normal rate.  Pulmonary:     Effort: Pulmonary effort is normal.  Abdominal:     General: Abdomen is flat.     Tenderness: There is no abdominal tenderness. There is no right CVA tenderness or left CVA tenderness.  Musculoskeletal:     Cervical back: Normal range of motion and neck supple.  Skin:    General: Skin is warm and dry.     Findings: Lesion present. No abscess, bruising or erythema.  Neurological:     Mental Status: He is alert and oriented to person, place, and time.     ED Results / Procedures / Treatments   Labs (all labs ordered are listed, but only abnormal results are displayed) Labs Reviewed - No data to display  EKG None  Radiology DG Elbow Complete Right  Result Date: 03/25/2022 CLINICAL DATA:  Dog bite EXAM: RIGHT ELBOW - COMPLETE 3+ VIEW COMPARISON:  None FINDINGS: No evidence of fracture of the ulna or humerus. The radial head is normal. No joint effusion. No foreign body IMPRESSION: No fracture or foreign body identified. Electronically Signed   By: Genevive Bi M.D.   On: 03/25/2022 15:08    Procedures Procedures    Medications Ordered in ED Medications - No data to display  ED  Course/ Medical Decision Making/ A&P                           Medical Decision Making   And here with requested suture removal which were placed 7 days ago after a dog bite.  I personally remove 7 stitches from patient's left arm he tolerated the procedure well.  In addition patient also has a mass that showed up above the right forearm, after the dog bite.  This is nontender, mobile, nonerythematous, nonindurated, with no fluctuance and no pulsatile mass present.  I have a lower suspicion for infection at this time, lower suspicion for aneurysm.  This mass is nontender, there is no signs of streaking in the skin to suggest cellulitis.  X-ray was performed which did not show any foreign body present.  His vitals are within normal limits.  I do feel that patient is adequate for outpatient follow-up with orthopedics.  He is agreeable to plan and treatment, patient stable for discharge.    Portions of this note were generated with Scientist, clinical (histocompatibility and immunogenetics). Dictation errors may occur despite best attempts at proofreading.   Final Clinical Impression(s) / ED Diagnoses Final diagnoses:  Encounter for staple removal  Mass of soft tissue of right upper extremity    Rx / DC Orders ED Discharge Orders     None         Claude Manges, PA-C 03/25/22 1831

## 2022-03-25 NOTE — ED Triage Notes (Signed)
Pt arrived POV from home stating he needs stitiches removed after being bit by a dog last week. Pt is also concerned because he know has a lump near his elbow on his right arm and that started forming after the dog bites.

## 2022-03-25 NOTE — ED Provider Triage Note (Signed)
Emergency Medicine Provider Triage Evaluation Note  Raymond Curtis , a 71 y.o. male  was evaluated in triage.  Pt complains of swelling in right arm.  Also here for suture removal from wound to his left arm.  Review of Systems  Positive: Right arm swelling, stitches in left arm Negative: Fever   Physical Exam  BP 106/66 (BP Location: Left Arm)   Pulse (!) 58   Temp 98.4 F (36.9 C) (Oral)   Resp 16   Ht 6' (1.829 m)   Wt 70.3 kg   SpO2 99%   BMI 21.02 kg/m  Gen:   Awake, no distress   Resp:  Normal effort  MSK:   Moves extremities without difficulty  Other:  Lump that is freely mobile in [facility]  Medical Decision Making  Medically screening exam initiated at 2:32 PM.  Appropriate orders placed.  Darnelle Bos was informed that the remainder of the evaluation will be completed by another provider, this initial triage assessment does not replace that evaluation, and the importance of remaining in the ED until their evaluation is complete.  Briefly discussed with my attending physician Dr. Adela Lank who recommends x-ray to evaluate for foreign body May consider bedside ultrasonography   Gailen Shelter, [place] 03/25/22 1434
